# Patient Record
Sex: Male | Born: 1980 | Race: Black or African American | Hispanic: No | Marital: Single | Smoking: Current every day smoker
Health system: Southern US, Community
[De-identification: ages and names within clinical notes are randomized; demographics above are authoritative.]

## PROBLEM LIST (undated history)

## (undated) DIAGNOSIS — W3400XA Accidental discharge from unspecified firearms or gun, initial encounter: Secondary | ICD-10-CM

## (undated) DIAGNOSIS — G43019 Migraine without aura, intractable, without status migrainosus: Secondary | ICD-10-CM

## (undated) DIAGNOSIS — G43909 Migraine, unspecified, not intractable, without status migrainosus: Secondary | ICD-10-CM

## (undated) HISTORY — PX: WISDOM TOOTH EXTRACTION: SHX21

## (undated) HISTORY — PX: OTHER SURGICAL HISTORY: SHX169

## (undated) HISTORY — PX: COLOSTOMY: SHX63

## (undated) HISTORY — PX: ABDOMINAL SURGERY: SHX537

## (undated) HISTORY — DX: Migraine without aura, intractable, without status migrainosus: G43.019

---
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
## 2003-03-21 ENCOUNTER — Inpatient Hospital Stay (HOSPITAL_COMMUNITY): Admission: AC | Admit: 2003-03-21 | Discharge: 2003-03-29 | Payer: Self-pay

## 2003-03-21 ENCOUNTER — Encounter: Payer: Self-pay | Admitting: Surgery

## 2003-03-22 ENCOUNTER — Encounter: Payer: Self-pay | Admitting: General Surgery

## 2003-03-23 ENCOUNTER — Encounter: Payer: Self-pay | Admitting: General Surgery

## 2003-03-24 ENCOUNTER — Encounter: Payer: Self-pay | Admitting: General Surgery

## 2003-03-25 ENCOUNTER — Encounter: Payer: Self-pay | Admitting: General Surgery

## 2003-08-06 ENCOUNTER — Emergency Department (HOSPITAL_COMMUNITY): Admission: EM | Admit: 2003-08-06 | Discharge: 2003-08-06 | Payer: Self-pay | Admitting: Emergency Medicine

## 2003-08-26 ENCOUNTER — Emergency Department (HOSPITAL_COMMUNITY): Admission: EM | Admit: 2003-08-26 | Discharge: 2003-08-26 | Payer: Self-pay | Admitting: Emergency Medicine

## 2003-09-04 ENCOUNTER — Emergency Department (HOSPITAL_COMMUNITY): Admission: EM | Admit: 2003-09-04 | Discharge: 2003-09-04 | Payer: Self-pay | Admitting: Emergency Medicine

## 2003-09-25 ENCOUNTER — Emergency Department (HOSPITAL_COMMUNITY): Admission: EM | Admit: 2003-09-25 | Discharge: 2003-09-25 | Payer: Self-pay | Admitting: Emergency Medicine

## 2003-11-11 ENCOUNTER — Emergency Department (HOSPITAL_COMMUNITY): Admission: EM | Admit: 2003-11-11 | Discharge: 2003-11-11 | Payer: Self-pay | Admitting: *Deleted

## 2004-01-11 ENCOUNTER — Emergency Department (HOSPITAL_COMMUNITY): Admission: EM | Admit: 2004-01-11 | Discharge: 2004-01-11 | Payer: Self-pay | Admitting: Emergency Medicine

## 2004-01-12 ENCOUNTER — Inpatient Hospital Stay (HOSPITAL_COMMUNITY): Admission: AD | Admit: 2004-01-12 | Discharge: 2004-01-13 | Payer: Self-pay

## 2004-01-14 ENCOUNTER — Emergency Department (HOSPITAL_COMMUNITY): Admission: EM | Admit: 2004-01-14 | Discharge: 2004-01-14 | Payer: Self-pay | Admitting: Emergency Medicine

## 2004-01-15 ENCOUNTER — Emergency Department (HOSPITAL_COMMUNITY): Admission: EM | Admit: 2004-01-15 | Discharge: 2004-01-15 | Payer: Self-pay | Admitting: Emergency Medicine

## 2004-03-09 ENCOUNTER — Emergency Department (HOSPITAL_COMMUNITY): Admission: EM | Admit: 2004-03-09 | Discharge: 2004-03-10 | Payer: Self-pay | Admitting: Emergency Medicine

## 2004-04-21 ENCOUNTER — Emergency Department (HOSPITAL_COMMUNITY): Admission: EM | Admit: 2004-04-21 | Discharge: 2004-04-21 | Payer: Self-pay | Admitting: Emergency Medicine

## 2005-02-02 ENCOUNTER — Emergency Department (HOSPITAL_COMMUNITY): Admission: EM | Admit: 2005-02-02 | Discharge: 2005-02-02 | Payer: Self-pay | Admitting: Orthopedic Surgery

## 2005-02-19 ENCOUNTER — Emergency Department (HOSPITAL_COMMUNITY): Admission: EM | Admit: 2005-02-19 | Discharge: 2005-02-20 | Payer: Self-pay | Admitting: Emergency Medicine

## 2005-02-19 ENCOUNTER — Emergency Department (HOSPITAL_COMMUNITY): Admission: EM | Admit: 2005-02-19 | Discharge: 2005-02-19 | Payer: Self-pay | Admitting: Emergency Medicine

## 2005-02-23 ENCOUNTER — Emergency Department (HOSPITAL_COMMUNITY): Admission: EM | Admit: 2005-02-23 | Discharge: 2005-02-23 | Payer: Self-pay | Admitting: Emergency Medicine

## 2005-02-25 ENCOUNTER — Encounter: Payer: Self-pay | Admitting: Emergency Medicine

## 2005-02-25 ENCOUNTER — Inpatient Hospital Stay (HOSPITAL_COMMUNITY): Admission: AD | Admit: 2005-02-25 | Discharge: 2005-03-01 | Payer: Self-pay

## 2005-02-25 ENCOUNTER — Encounter (INDEPENDENT_AMBULATORY_CARE_PROVIDER_SITE_OTHER): Payer: Self-pay | Admitting: Specialist

## 2005-03-05 ENCOUNTER — Inpatient Hospital Stay (HOSPITAL_COMMUNITY): Admission: EM | Admit: 2005-03-05 | Discharge: 2005-03-06 | Payer: Self-pay | Admitting: Emergency Medicine

## 2005-03-19 ENCOUNTER — Ambulatory Visit (HOSPITAL_COMMUNITY): Admission: RE | Admit: 2005-03-19 | Discharge: 2005-03-19 | Payer: Self-pay | Admitting: Physician Assistant

## 2005-09-08 ENCOUNTER — Emergency Department (HOSPITAL_COMMUNITY): Admission: EM | Admit: 2005-09-08 | Discharge: 2005-09-08 | Payer: Self-pay | Admitting: Emergency Medicine

## 2005-09-12 ENCOUNTER — Emergency Department (HOSPITAL_COMMUNITY): Admission: EM | Admit: 2005-09-12 | Discharge: 2005-09-12 | Payer: Self-pay | Admitting: Emergency Medicine

## 2005-09-17 ENCOUNTER — Emergency Department (HOSPITAL_COMMUNITY): Admission: EM | Admit: 2005-09-17 | Discharge: 2005-09-17 | Payer: Self-pay | Admitting: Family Medicine

## 2005-09-17 ENCOUNTER — Emergency Department (HOSPITAL_COMMUNITY): Admission: EM | Admit: 2005-09-17 | Discharge: 2005-09-17 | Payer: Self-pay | Admitting: Emergency Medicine

## 2005-09-20 ENCOUNTER — Emergency Department (HOSPITAL_COMMUNITY): Admission: EM | Admit: 2005-09-20 | Discharge: 2005-09-20 | Payer: Self-pay | Admitting: Emergency Medicine

## 2005-10-10 ENCOUNTER — Emergency Department (HOSPITAL_COMMUNITY): Admission: EM | Admit: 2005-10-10 | Discharge: 2005-10-10 | Payer: Self-pay | Admitting: Emergency Medicine

## 2005-10-11 ENCOUNTER — Emergency Department (HOSPITAL_COMMUNITY): Admission: EM | Admit: 2005-10-11 | Discharge: 2005-10-11 | Payer: Self-pay | Admitting: Emergency Medicine

## 2006-01-02 ENCOUNTER — Inpatient Hospital Stay (HOSPITAL_COMMUNITY): Admission: EM | Admit: 2006-01-02 | Discharge: 2006-01-05 | Payer: Self-pay | Admitting: Emergency Medicine

## 2006-03-11 ENCOUNTER — Ambulatory Visit (HOSPITAL_COMMUNITY): Admission: RE | Admit: 2006-03-11 | Discharge: 2006-03-11 | Payer: Self-pay | Admitting: General Surgery

## 2008-07-30 ENCOUNTER — Emergency Department (HOSPITAL_COMMUNITY): Admission: EM | Admit: 2008-07-30 | Discharge: 2008-07-31 | Payer: Self-pay | Admitting: Emergency Medicine

## 2008-10-22 ENCOUNTER — Emergency Department (HOSPITAL_COMMUNITY): Admission: EM | Admit: 2008-10-22 | Discharge: 2008-10-22 | Payer: Self-pay | Admitting: Emergency Medicine

## 2008-12-13 ENCOUNTER — Emergency Department (HOSPITAL_COMMUNITY): Admission: EM | Admit: 2008-12-13 | Discharge: 2008-12-13 | Payer: Self-pay | Admitting: Emergency Medicine

## 2008-12-20 ENCOUNTER — Emergency Department (HOSPITAL_COMMUNITY): Admission: EM | Admit: 2008-12-20 | Discharge: 2008-12-20 | Payer: Self-pay | Admitting: Emergency Medicine

## 2009-01-03 ENCOUNTER — Emergency Department (HOSPITAL_COMMUNITY): Admission: EM | Admit: 2009-01-03 | Discharge: 2009-01-03 | Payer: Self-pay | Admitting: Emergency Medicine

## 2009-01-07 ENCOUNTER — Emergency Department (HOSPITAL_COMMUNITY): Admission: EM | Admit: 2009-01-07 | Discharge: 2009-01-07 | Payer: Self-pay | Admitting: Emergency Medicine

## 2009-01-11 ENCOUNTER — Emergency Department (HOSPITAL_COMMUNITY): Admission: EM | Admit: 2009-01-11 | Discharge: 2009-01-12 | Payer: Self-pay | Admitting: Emergency Medicine

## 2009-01-16 ENCOUNTER — Emergency Department (HOSPITAL_COMMUNITY): Admission: EM | Admit: 2009-01-16 | Discharge: 2009-01-16 | Payer: Self-pay | Admitting: Emergency Medicine

## 2009-01-18 ENCOUNTER — Emergency Department (HOSPITAL_COMMUNITY): Admission: EM | Admit: 2009-01-18 | Discharge: 2009-01-18 | Payer: Self-pay | Admitting: Emergency Medicine

## 2009-01-29 ENCOUNTER — Emergency Department (HOSPITAL_COMMUNITY): Admission: EM | Admit: 2009-01-29 | Discharge: 2009-01-29 | Payer: Self-pay | Admitting: Emergency Medicine

## 2009-03-25 ENCOUNTER — Emergency Department (HOSPITAL_COMMUNITY): Admission: EM | Admit: 2009-03-25 | Discharge: 2009-03-25 | Payer: Self-pay | Admitting: Emergency Medicine

## 2009-07-21 ENCOUNTER — Emergency Department (HOSPITAL_COMMUNITY): Admission: EM | Admit: 2009-07-21 | Discharge: 2009-07-21 | Payer: Self-pay | Admitting: Emergency Medicine

## 2009-08-24 ENCOUNTER — Emergency Department (HOSPITAL_COMMUNITY): Admission: EM | Admit: 2009-08-24 | Discharge: 2009-08-24 | Payer: Self-pay | Admitting: Emergency Medicine

## 2009-09-27 ENCOUNTER — Emergency Department (HOSPITAL_COMMUNITY): Admission: EM | Admit: 2009-09-27 | Discharge: 2009-09-27 | Payer: Self-pay | Admitting: Emergency Medicine

## 2009-10-04 ENCOUNTER — Emergency Department (HOSPITAL_COMMUNITY): Admission: EM | Admit: 2009-10-04 | Discharge: 2009-10-05 | Payer: Self-pay | Admitting: Emergency Medicine

## 2009-10-22 ENCOUNTER — Ambulatory Visit: Payer: Self-pay | Admitting: Family Medicine

## 2009-10-22 DIAGNOSIS — R1084 Generalized abdominal pain: Secondary | ICD-10-CM

## 2009-10-22 DIAGNOSIS — L819 Disorder of pigmentation, unspecified: Secondary | ICD-10-CM | POA: Insufficient documentation

## 2009-10-22 LAB — CONVERTED CEMR LAB
AST: 22 units/L (ref 0–37)
Albumin: 4.6 g/dL (ref 3.5–5.2)
Alkaline Phosphatase: 65 units/L (ref 39–117)
BUN: 9 mg/dL (ref 6–23)
Barbiturate Quant, Ur: NEGATIVE
Cocaine Metabolites: POSITIVE — AB
Creatinine, Ser: 0.96 mg/dL (ref 0.40–1.50)
Creatinine,U: 176.4 mg/dL
Glucose, Bld: 82 mg/dL (ref 70–99)
HCT: 40.8 % (ref 39.0–52.0)
HCV Ab: NEGATIVE
Hemoglobin: 14 g/dL (ref 13.0–17.0)
Hep B S Ab: NEGATIVE
MCHC: 34.3 g/dL (ref 30.0–36.0)
MCV: 86.6 fL (ref 78.0–100.0)
Opiates: NEGATIVE
Phencyclidine (PCP): NEGATIVE
Propoxyphene: NEGATIVE
RBC: 4.71 M/uL (ref 4.22–5.81)
RDW: 12.6 % (ref 11.5–15.5)

## 2009-10-25 ENCOUNTER — Telehealth: Payer: Self-pay | Admitting: Family Medicine

## 2009-10-25 ENCOUNTER — Encounter: Payer: Self-pay | Admitting: Family Medicine

## 2009-10-25 DIAGNOSIS — F1410 Cocaine abuse, uncomplicated: Secondary | ICD-10-CM | POA: Insufficient documentation

## 2009-10-25 DIAGNOSIS — F1210 Cannabis abuse, uncomplicated: Secondary | ICD-10-CM | POA: Insufficient documentation

## 2009-11-19 ENCOUNTER — Emergency Department (HOSPITAL_COMMUNITY): Admission: EM | Admit: 2009-11-19 | Discharge: 2009-11-19 | Payer: Self-pay | Admitting: Emergency Medicine

## 2010-01-07 ENCOUNTER — Emergency Department (HOSPITAL_COMMUNITY): Admission: EM | Admit: 2010-01-07 | Discharge: 2010-01-07 | Payer: Self-pay | Admitting: Emergency Medicine

## 2010-01-17 ENCOUNTER — Encounter: Payer: Self-pay | Admitting: Family Medicine

## 2010-01-21 ENCOUNTER — Emergency Department (HOSPITAL_COMMUNITY): Admission: EM | Admit: 2010-01-21 | Discharge: 2010-01-21 | Payer: Self-pay | Admitting: Emergency Medicine

## 2010-02-07 ENCOUNTER — Emergency Department (HOSPITAL_COMMUNITY): Admission: EM | Admit: 2010-02-07 | Discharge: 2010-02-07 | Payer: Self-pay | Admitting: Emergency Medicine

## 2010-02-11 ENCOUNTER — Encounter (INDEPENDENT_AMBULATORY_CARE_PROVIDER_SITE_OTHER): Payer: Self-pay | Admitting: Surgery

## 2010-02-11 ENCOUNTER — Inpatient Hospital Stay (HOSPITAL_COMMUNITY): Admission: RE | Admit: 2010-02-11 | Discharge: 2010-02-18 | Payer: Self-pay | Admitting: Surgery

## 2010-02-20 ENCOUNTER — Inpatient Hospital Stay (HOSPITAL_COMMUNITY): Admission: AD | Admit: 2010-02-20 | Discharge: 2010-02-26 | Payer: Self-pay

## 2010-02-28 ENCOUNTER — Emergency Department (HOSPITAL_COMMUNITY): Admission: EM | Admit: 2010-02-28 | Discharge: 2010-02-28 | Payer: Self-pay | Admitting: Emergency Medicine

## 2010-05-08 ENCOUNTER — Emergency Department (HOSPITAL_COMMUNITY): Admission: EM | Admit: 2010-05-08 | Discharge: 2009-10-10 | Payer: Self-pay | Admitting: Emergency Medicine
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
## 2010-07-01 NOTE — Letter (Signed)
Summary: Generic Letter  [facility]  [address]   [place], [place] [address]   Phone: [phone]  Fax: [phone]    10/25/2009  [facility] [address] [place], [place]  [address]  Dear Mr. Elders,   It was a pleasure to meet you at your first visit in our office earlier this week.  I have tried to call you at the number you gave our office [phone]) but the number is no longer in service.  I am pleased to tell you that your bloodwork all came back in the normal range.    Please call if you have any questions.      Sincerely,   Paula Compton MD  Appended Document: Generic Letter mailed.

## 2010-07-01 NOTE — Miscellaneous (Signed)
  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

## 2010-07-01 NOTE — Progress Notes (Signed)
  Phone Note Outgoing Call   Call placed by: Paula Compton MD,  Oct 25, 2009 8:56 AM Call placed to: Patient Summary of Call: Called patient at [phone].  Call could not be completed because "number not in service".  I wish to inform him of negative HIV, negative Hepatitis panel, and normal CBC and metabolic panel.  I will send a letter informing of negative bloodwork.  I prefer to wait to discuss results of UDS in next face-to-face encounter. Initial call taken by: Paula Compton MD,  Oct 25, 2009 8:58 AM

## 2010-07-04 NOTE — Consult Note (Signed)
Summary: Disability Determination Services  Disability Determination Services   Imported By: Clydell Hakim 02/06/2010 16:03:34  _____________________________________________________________________  External Attachment:    Type:   Image     Comment:   External Document

## 2010-08-14 LAB — DIFFERENTIAL
Basophils Absolute: 0.1 10*3/uL (ref 0.0–0.1)
Eosinophils Absolute: 0.5 10*3/uL (ref 0.0–0.7)
Lymphocytes Relative: 24 % (ref 12–46)
Lymphocytes Relative: 30 % (ref 12–46)
Lymphocytes Relative: 33 % (ref 12–46)
Lymphs Abs: 2.3 10*3/uL (ref 0.7–4.0)
Lymphs Abs: 2.8 10*3/uL (ref 0.7–4.0)
Lymphs Abs: 3.3 10*3/uL (ref 0.7–4.0)
Monocytes Absolute: 1 10*3/uL (ref 0.1–1.0)
Monocytes Relative: 8 % (ref 3–12)
Neutro Abs: 5 10*3/uL (ref 1.7–7.7)
Neutro Abs: 5.3 10*3/uL (ref 1.7–7.7)
Neutrophils Relative %: 58 % (ref 43–77)
Neutrophils Relative %: 64 % (ref 43–77)

## 2010-08-14 LAB — URINE MICROSCOPIC-ADD ON

## 2010-08-14 LAB — CBC
HCT: 24.5 % — ABNORMAL LOW (ref 39.0–52.0)
HCT: 25.1 % — ABNORMAL LOW (ref 39.0–52.0)
HCT: 26.9 % — ABNORMAL LOW (ref 39.0–52.0)
HCT: 27.5 % — ABNORMAL LOW (ref 39.0–52.0)
HCT: 27.9 % — ABNORMAL LOW (ref 39.0–52.0)
Hemoglobin: 12.2 g/dL — ABNORMAL LOW (ref 13.0–17.0)
Hemoglobin: 14.4 g/dL (ref 13.0–17.0)
Hemoglobin: 8.4 g/dL — ABNORMAL LOW (ref 13.0–17.0)
Hemoglobin: 9.1 g/dL — ABNORMAL LOW (ref 13.0–17.0)
Hemoglobin: 9.5 g/dL — ABNORMAL LOW (ref 13.0–17.0)
MCH: 28.6 pg (ref 26.0–34.0)
MCH: 30.5 pg (ref 26.0–34.0)
MCHC: 33.1 g/dL (ref 30.0–36.0)
MCHC: 33.5 g/dL (ref 30.0–36.0)
MCHC: 34.2 g/dL (ref 30.0–36.0)
MCV: 85.4 fL (ref 78.0–100.0)
MCV: 86.3 fL (ref 78.0–100.0)
MCV: 86.4 fL (ref 78.0–100.0)
MCV: 86.6 fL (ref 78.0–100.0)
Platelets: 172 10*3/uL (ref 150–400)
Platelets: 494 10*3/uL — ABNORMAL HIGH (ref 150–400)
Platelets: 233 10*3/uL (ref 150–400)
RBC: 2.91 MIL/uL — ABNORMAL LOW (ref 4.22–5.81)
RBC: 2.94 MIL/uL — ABNORMAL LOW (ref 4.22–5.81)
RBC: 3.15 MIL/uL — ABNORMAL LOW (ref 4.22–5.81)
RBC: 3.31 MIL/uL — ABNORMAL LOW (ref 4.22–5.81)
RBC: 3.77 MIL/uL — ABNORMAL LOW (ref 4.22–5.81)
RBC: 4.02 MIL/uL — ABNORMAL LOW (ref 4.22–5.81)
RBC: 4.57 MIL/uL (ref 4.22–5.81)
RBC: 4.73 MIL/uL (ref 4.22–5.81)
RDW: 12.2 % (ref 11.5–15.5)
RDW: 12.7 % (ref 11.5–15.5)
RDW: 12.8 % (ref 11.5–15.5)
WBC: 10.8 10*3/uL — ABNORMAL HIGH (ref 4.0–10.5)
WBC: 12.6 10*3/uL — ABNORMAL HIGH (ref 4.0–10.5)
WBC: 13.1 10*3/uL — ABNORMAL HIGH (ref 4.0–10.5)
WBC: 15.3 10*3/uL — ABNORMAL HIGH (ref 4.0–10.5)
WBC: 9.1 10*3/uL (ref 4.0–10.5)
WBC: 9.6 10*3/uL (ref 4.0–10.5)

## 2010-08-14 LAB — BASIC METABOLIC PANEL
BUN: 2 mg/dL — ABNORMAL LOW (ref 6–23)
BUN: 3 mg/dL — ABNORMAL LOW (ref 6–23)
BUN: 4 mg/dL — ABNORMAL LOW (ref 6–23)
BUN: 9 mg/dL (ref 6–23)
CO2: 26 mEq/L (ref 19–32)
CO2: 29 mEq/L (ref 19–32)
Calcium: 8.7 mg/dL (ref 8.4–10.5)
Calcium: 9.1 mg/dL (ref 8.4–10.5)
Calcium: 9.7 mg/dL (ref 8.4–10.5)
Calcium: 9.9 mg/dL (ref 8.4–10.5)
Chloride: 101 mEq/L (ref 96–112)
Chloride: 101 mEq/L (ref 96–112)
Chloride: 103 mEq/L (ref 96–112)
Chloride: 103 mEq/L (ref 96–112)
Creatinine, Ser: 0.98 mg/dL (ref 0.4–1.5)
Creatinine, Ser: 0.89 mg/dL (ref 0.4–1.5)
GFR calc Af Amer: >60 mL/min (ref >60)
GFR calc Af Amer: >60 mL/min (ref >60)
GFR calc Af Amer: >60 mL/min (ref >60)
GFR calc Af Amer: >60 mL/min (ref >60)
GFR calc Af Amer: >60 mL/min (ref >60)
GFR calc non Af Amer: >60 mL/min (ref >60)
GFR calc non Af Amer: >60 mL/min (ref >60)
GFR calc non Af Amer: >60 mL/min (ref >60)
Glucose, Bld: 88 mg/dL (ref 70–99)
Glucose, Bld: 95 mg/dL (ref 70–99)
Glucose, Bld: 156 mg/dL — ABNORMAL HIGH (ref 70–99)
Potassium: 3.3 mEq/L — ABNORMAL LOW (ref 3.5–5.1)
Potassium: 3.4 mEq/L — ABNORMAL LOW (ref 3.5–5.1)
Potassium: 3.6 mEq/L (ref 3.5–5.1)
Potassium: 3.9 mEq/L (ref 3.5–5.1)
Sodium: 136 mEq/L (ref 135–145)
Sodium: 140 mEq/L (ref 135–145)

## 2010-08-14 LAB — URINALYSIS, ROUTINE W REFLEX MICROSCOPIC
Bilirubin Urine: NEGATIVE
Glucose, UA: NEGATIVE mg/dL
Hgb urine dipstick: NEGATIVE
Hgb urine dipstick: NEGATIVE
Ketones, ur: 15 mg/dL — AB
Nitrite: NEGATIVE
Protein, ur: NEGATIVE mg/dL
Protein, ur: NEGATIVE mg/dL
Specific Gravity, Urine: 1.019 (ref 1.005–1.030)
Specific Gravity, Urine: 1.024 (ref 1.005–1.030)
Urobilinogen, UA: 0.2 mg/dL (ref 0.0–1.0)
Urobilinogen, UA: 0.2 mg/dL (ref 0.0–1.0)
pH: 5.5 (ref 5.0–8.0)

## 2010-08-14 LAB — POCT I-STAT, CHEM 8
Calcium, Ion: 1.2 mmol/L (ref 1.12–1.32)
Glucose, Bld: 90 mg/dL (ref 70–99)
HCT: 31 % — ABNORMAL LOW (ref 39.0–52.0)
Hemoglobin: 10.5 g/dL — ABNORMAL LOW (ref 13.0–17.0)
Potassium: 3.2 mEq/L — ABNORMAL LOW (ref 3.5–5.1)
TCO2: 31 mmol/L (ref 0–100)

## 2010-08-14 LAB — URINALYSIS, MICROSCOPIC ONLY
Bilirubin Urine: NEGATIVE
Glucose, UA: NEGATIVE mg/dL
Hgb urine dipstick: NEGATIVE
Specific Gravity, Urine: 1.027 (ref 1.005–1.030)
Urobilinogen, UA: 0.2 mg/dL (ref 0.0–1.0)
pH: 6 (ref 5.0–8.0)

## 2010-08-14 LAB — HEPATIC FUNCTION PANEL
ALT: 24 U/L (ref 0–53)
Albumin: 4.9 g/dL (ref 3.5–5.2)
Alkaline Phosphatase: 55 U/L (ref 39–117)
Total Protein: 8.4 g/dL — ABNORMAL HIGH (ref 6.0–8.3)

## 2010-08-14 LAB — CULTURE, ROUTINE-ABSCESS

## 2010-08-14 LAB — MRSA PCR SCREENING: MRSA by PCR: NEGATIVE

## 2010-08-14 LAB — URINE CULTURE: Colony Count: NO GROWTH

## 2010-08-14 LAB — MRSA CULTURE

## 2010-08-15 LAB — CBC
Hemoglobin: 13.1 g/dL (ref 13.0–17.0)
MCH: 30.4 pg (ref 26.0–34.0)
MCV: 90 fL (ref 78.0–100.0)
RBC: 4.31 MIL/uL (ref 4.22–5.81)
WBC: 9.2 10*3/uL (ref 4.0–10.5)

## 2010-08-15 LAB — DIFFERENTIAL
Eosinophils Absolute: 0.3 10*3/uL (ref 0.0–0.7)
Eosinophils Relative: 4 % (ref 0–5)
Lymphocytes Relative: 26 % (ref 12–46)
Lymphs Abs: 2.4 10*3/uL (ref 0.7–4.0)
Monocytes Relative: 7 % (ref 3–12)
Neutrophils Relative %: 62 % (ref 43–77)

## 2010-08-15 LAB — BASIC METABOLIC PANEL
CO2: 27 mEq/L (ref 19–32)
Chloride: 106 mEq/L (ref 96–112)
GFR calc Af Amer: >60 mL/min (ref >60)
Sodium: 141 mEq/L (ref 135–145)

## 2010-08-15 LAB — LACTIC ACID, PLASMA: Lactic Acid, Venous: 0.8 mmol/L (ref 0.5–2.2)

## 2010-08-17 LAB — GC/CHLAMYDIA PROBE AMP, GENITAL: GC Probe Amp, Genital: NEGATIVE

## 2010-08-24 LAB — POCT CARDIAC MARKERS
Myoglobin, poc: 72 ng/mL (ref 12–200)
Troponin i, poc: <0.05 ng/mL (ref 0.00–0.09)
Troponin i, poc: <0.05 ng/mL (ref 0.00–0.09)

## 2010-08-24 LAB — COMPREHENSIVE METABOLIC PANEL
ALT: 23 U/L (ref 0–53)
AST: 45 U/L — ABNORMAL HIGH (ref 0–37)
Alkaline Phosphatase: 65 U/L (ref 39–117)
CO2: 21 mEq/L (ref 19–32)
Calcium: 10.2 mg/dL (ref 8.4–10.5)
Chloride: 100 mEq/L (ref 96–112)
GFR calc Af Amer: >60 mL/min (ref >60)
GFR calc non Af Amer: >60 mL/min (ref >60)
Glucose, Bld: 84 mg/dL (ref 70–99)
Sodium: 138 mEq/L (ref 135–145)
Total Bilirubin: 1.2 mg/dL (ref 0.3–1.2)

## 2010-08-24 LAB — CBC
Hemoglobin: 15 g/dL (ref 13.0–17.0)
MCHC: 34 g/dL (ref 30.0–36.0)
RBC: 4.9 MIL/uL (ref 4.22–5.81)
WBC: 11.7 10*3/uL — ABNORMAL HIGH (ref 4.0–10.5)

## 2010-08-24 LAB — URINALYSIS, ROUTINE W REFLEX MICROSCOPIC
Glucose, UA: NEGATIVE mg/dL
Ketones, ur: >80 mg/dL — AB
Leukocytes, UA: NEGATIVE
Protein, ur: 30 mg/dL — AB
Urobilinogen, UA: 0.2 mg/dL (ref 0.0–1.0)

## 2010-08-24 LAB — DIFFERENTIAL
Basophils Absolute: 0 10*3/uL (ref 0.0–0.1)
Basophils Relative: 0 % (ref 0–1)
Eosinophils Absolute: 0 10*3/uL (ref 0.0–0.7)
Eosinophils Relative: 0 % (ref 0–5)
Neutrophils Relative %: 88 % — ABNORMAL HIGH (ref 43–77)

## 2010-08-24 LAB — URINE MICROSCOPIC-ADD ON

## 2010-09-06 LAB — DIFFERENTIAL
Basophils Absolute: 0.1 10*3/uL (ref 0.0–0.1)
Basophils Relative: 1 % (ref 0–1)
Eosinophils Absolute: 0.8 10*3/uL — ABNORMAL HIGH (ref 0.0–0.7)
Eosinophils Absolute: 0.8 10*3/uL — ABNORMAL HIGH (ref 0.0–0.7)
Eosinophils Relative: 6 % — ABNORMAL HIGH (ref 0–5)
Eosinophils Relative: 7 % — ABNORMAL HIGH (ref 0–5)
Lymphocytes Relative: 26 % (ref 12–46)
Lymphs Abs: 3.4 10*3/uL (ref 0.7–4.0)
Monocytes Absolute: 0.9 10*3/uL (ref 0.1–1.0)
Monocytes Relative: 7 % (ref 3–12)
Neutrophils Relative %: 62 % (ref 43–77)

## 2010-09-06 LAB — CBC
HCT: 43.9 % (ref 39.0–52.0)
MCHC: 33.6 g/dL (ref 30.0–36.0)
MCHC: 33 g/dL (ref 30.0–36.0)
MCV: 89.7 fL (ref 78.0–100.0)
MCV: 90.2 fL (ref 78.0–100.0)
Platelets: 211 10*3/uL (ref 150–400)
Platelets: 196 10*3/uL (ref 150–400)
RDW: 13.2 % (ref 11.5–15.5)
WBC: 13.3 10*3/uL — ABNORMAL HIGH (ref 4.0–10.5)
WBC: 11.8 10*3/uL — ABNORMAL HIGH (ref 4.0–10.5)

## 2010-09-06 LAB — POCT I-STAT, CHEM 8
Creatinine, Ser: 1.2 mg/dL (ref 0.4–1.5)
HCT: 47 % (ref 39.0–52.0)
Hemoglobin: 16 g/dL (ref 13.0–17.0)
Potassium: 4 mEq/L (ref 3.5–5.1)
Sodium: 140 mEq/L (ref 135–145)
TCO2: 26 mmol/L (ref 0–100)

## 2010-09-06 LAB — BASIC METABOLIC PANEL
BUN: 15 mg/dL (ref 6–23)
CO2: 27 mEq/L (ref 19–32)
Calcium: 9.5 mg/dL (ref 8.4–10.5)
Chloride: 105 mEq/L (ref 96–112)
Creatinine, Ser: 1.23 mg/dL (ref 0.4–1.5)

## 2010-09-07 LAB — DIFFERENTIAL
Basophils Absolute: 0 10*3/uL (ref 0.0–0.1)
Basophils Relative: 0 % (ref 0–1)
Lymphocytes Relative: 24 % (ref 12–46)
Monocytes Absolute: 1 10*3/uL (ref 0.1–1.0)
Monocytes Relative: 9 % (ref 3–12)
Neutro Abs: 6.9 10*3/uL (ref 1.7–7.7)
Neutrophils Relative %: 63 % (ref 43–77)

## 2010-09-07 LAB — CBC
Hemoglobin: 14.7 g/dL (ref 13.0–17.0)
MCHC: 33 g/dL (ref 30.0–36.0)
RBC: 5.03 MIL/uL (ref 4.22–5.81)
RDW: 13.4 % (ref 11.5–15.5)

## 2010-09-07 LAB — BASIC METABOLIC PANEL
CO2: 27 mEq/L (ref 19–32)
Calcium: 9.8 mg/dL (ref 8.4–10.5)
Creatinine, Ser: 1.11 mg/dL (ref 0.4–1.5)
GFR calc Af Amer: >60 mL/min (ref >60)
GFR calc non Af Amer: >60 mL/min (ref >60)
Sodium: 139 mEq/L (ref 135–145)
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
## 2010-10-14 NOTE — Consult Note (Signed)
NAMESRIRAM, FEBLES                 ACCOUNT NO.:  [id number]   MEDICAL RECORD NO.:  [id number]          PATIENT TYPE:  EMS   LOCATION:  ED                           FACILITY:  [facility]   PHYSICIAN:  Lennie Muckle, MD      DATE OF BIRTH:  September 02, 1980   DATE OF CONSULTATION:  01/17/2009  DATE OF DISCHARGE:  01/16/2009                                 CONSULTATION   HISTORY:  Mr. Essick is a 30 year old male who has an ostomy in the right  lower quadrant.  I have seen him 4 weeks ago for prolapsed ostomy. He  apparently came back to the emergency department with prolapse, reduced  by Dr. Bebe Shaggy.  He was given 6 of Dilaudid.  The patient states that  he had called the office and was told he had to pay 500 dollars prior to  being seen.  Did see him in the emergency department.  His vitals were  stable.  He had blood pressure 120/78, temperature 97.2.  He was lying  on the stretcher in no acute distress.  He was complaining of left-sided  abdominal pain and prolapsed ostomy.  On examination he did have  prolapse of the ostomy.  No peritoneal signs.   ASSESSMENT AND PLAN:  Prolapsed ostomy. This was reduced successfully in  the emergency department.  I told the patient to call the office to talk  with the financial counselor in order to set up a payment plan in order  to facilitate being scheduled for an ostomy takedown.      Lennie Muckle, MD  Electronically Signed     ALA/MEDQ  D:  01/16/2009  T:  01/17/2009  Job:  [id number]
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
## 2010-10-17 NOTE — Op Note (Signed)
[name], [name]                             ACCOUNT NO.:  [id number]   MEDICAL RECORD NO.:  [id number]                   PATIENT TYPE:  EMS   LOCATION:  ED                                   FACILITY:  [facility]   PHYSICIAN:  Abigail Miyamoto, M.D.              DATE OF BIRTH:  1981-03-07   DATE OF PROCEDURE:  01/11/2004  DATE OF DISCHARGE:  01/11/2004                                 OPERATIVE REPORT   PREOPERATIVE DIAGNOSIS:  Prolapsed colostomy.   POSTOPERATIVE DIAGNOSIS:  Prolapsed colostomy.   PROCEDURE:  Reduction of prolapsed colostomy.   SURGEON:  Dr. Riley Lam A. Blackman   ANESTHESIA:  General endotracheal anesthesia.   ESTIMATED BLOOD LOSS:  Minimal.   PROCEDURE IN DETAIL:  The patient was brought to the operating room,  identified as [name]  He was placed supine on the operating room  table, and general anesthesia was induced.  He was found to have a protruded  swollen ostomy which was protruding approximately 4-5 inches.  It was  circumferentially injected with a solution of 3 mL Wydase and 7 mL Marcaine  without epinephrine.  After this was done, the prolapse was massaged, and  all the edema was able to be massaged out, and then it was finally easily  reduced.  Once it was reduced, a proctoscope was inserted into the ostomy,  and the mucosa was examined circumferentially and found to be viable.  At  this point, the patient was then extubated in the operating room.  An ostomy  appliance was placed back on top of the ostomy, and the patient was taken in  stable condition from the operating room to the recovery room.                                               Abigail Miyamoto, M.D.    DB/MEDQ  D:  01/11/2004  T:  01/13/2004  Job:  [id number]
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
## 2011-04-03 ENCOUNTER — Emergency Department (HOSPITAL_COMMUNITY)
Admission: EM | Admit: 2011-04-03 | Discharge: 2011-04-03 | Disposition: A | Discharge status: Home / Self Care | Payer: Self-pay | Attending: Emergency Medicine | Admitting: Emergency Medicine

## 2011-04-03 ENCOUNTER — Emergency Department (HOSPITAL_COMMUNITY): Payer: Self-pay

## 2011-04-03 DIAGNOSIS — R1084 Generalized abdominal pain: Secondary | ICD-10-CM | POA: Insufficient documentation

## 2011-04-03 DIAGNOSIS — B9789 Other viral agents as the cause of diseases classified elsewhere: Secondary | ICD-10-CM | POA: Insufficient documentation

## 2011-04-03 DIAGNOSIS — E860 Dehydration: Secondary | ICD-10-CM | POA: Insufficient documentation

## 2011-04-03 DIAGNOSIS — R112 Nausea with vomiting, unspecified: Secondary | ICD-10-CM | POA: Insufficient documentation

## 2011-04-03 LAB — CBC
HCT: 39.5 % (ref 39.0–52.0)
Hemoglobin: 13.7 g/dL (ref 13.0–17.0)
MCV: 85.3 fL (ref 78.0–100.0)
RBC: 4.63 MIL/uL (ref 4.22–5.81)
WBC: 12.1 10*3/uL — ABNORMAL HIGH (ref 4.0–10.5)

## 2011-04-03 LAB — COMPREHENSIVE METABOLIC PANEL
Albumin: 4.1 g/dL (ref 3.5–5.2)
Alkaline Phosphatase: 64 U/L (ref 39–117)
BUN: 8 mg/dL (ref 6–23)
CO2: 21 mEq/L (ref 19–32)
Chloride: 103 mEq/L (ref 96–112)
Creatinine, Ser: 1.05 mg/dL (ref 0.50–1.35)
GFR calc Af Amer: >90 mL/min (ref >90)
GFR calc non Af Amer: >90 mL/min (ref >90)
Glucose, Bld: 123 mg/dL — ABNORMAL HIGH (ref 70–99)
Potassium: 3 mEq/L — ABNORMAL LOW (ref 3.5–5.1)
Total Bilirubin: 0.5 mg/dL (ref 0.3–1.2)

## 2011-04-03 LAB — URINALYSIS, ROUTINE W REFLEX MICROSCOPIC
Glucose, UA: NEGATIVE mg/dL
Hgb urine dipstick: NEGATIVE
Protein, ur: 30 mg/dL — AB

## 2011-04-03 LAB — DIFFERENTIAL
Basophils Absolute: 0 10*3/uL (ref 0.0–0.1)
Lymphocytes Relative: 8 % — ABNORMAL LOW (ref 12–46)
Lymphs Abs: 1 10*3/uL (ref 0.7–4.0)
Neutro Abs: 9.6 10*3/uL — ABNORMAL HIGH (ref 1.7–7.7)

## 2011-04-03 LAB — LIPASE, BLOOD: Lipase: 20 U/L (ref 11–59)

## 2011-05-23 ENCOUNTER — Emergency Department (HOSPITAL_COMMUNITY)
Admission: EM | Admit: 2011-05-23 | Discharge: 2011-05-23 | Disposition: A | Discharge status: Home / Self Care | Payer: Self-pay | Attending: Emergency Medicine | Admitting: Emergency Medicine

## 2011-05-23 ENCOUNTER — Emergency Department (HOSPITAL_COMMUNITY): Payer: Self-pay

## 2011-05-23 ENCOUNTER — Encounter: Payer: Self-pay | Admitting: Emergency Medicine

## 2011-05-23 DIAGNOSIS — M79609 Pain in unspecified limb: Secondary | ICD-10-CM | POA: Insufficient documentation

## 2011-05-23 DIAGNOSIS — R112 Nausea with vomiting, unspecified: Secondary | ICD-10-CM | POA: Insufficient documentation

## 2011-05-23 DIAGNOSIS — K529 Noninfective gastroenteritis and colitis, unspecified: Secondary | ICD-10-CM

## 2011-05-23 DIAGNOSIS — E860 Dehydration: Secondary | ICD-10-CM | POA: Insufficient documentation

## 2011-05-23 DIAGNOSIS — Z9889 Other specified postprocedural states: Secondary | ICD-10-CM | POA: Insufficient documentation

## 2011-05-23 DIAGNOSIS — R10819 Abdominal tenderness, unspecified site: Secondary | ICD-10-CM | POA: Insufficient documentation

## 2011-05-23 DIAGNOSIS — K5289 Other specified noninfective gastroenteritis and colitis: Secondary | ICD-10-CM | POA: Insufficient documentation

## 2011-05-23 DIAGNOSIS — R1084 Generalized abdominal pain: Secondary | ICD-10-CM | POA: Insufficient documentation

## 2011-05-23 LAB — URINALYSIS, ROUTINE W REFLEX MICROSCOPIC
Leukocytes, UA: NEGATIVE
Protein, ur: NEGATIVE mg/dL
Urobilinogen, UA: 0.2 mg/dL (ref 0.0–1.0)

## 2011-05-23 LAB — COMPREHENSIVE METABOLIC PANEL
BUN: 15 mg/dL (ref 6–23)
CO2: 22 mEq/L (ref 19–32)
Chloride: 101 mEq/L (ref 96–112)
Creatinine, Ser: 1.02 mg/dL (ref 0.50–1.35)
GFR calc non Af Amer: >90 mL/min (ref >90)
Total Bilirubin: 0.5 mg/dL (ref 0.3–1.2)

## 2011-05-23 LAB — DIFFERENTIAL
Basophils Absolute: 0 10*3/uL (ref 0.0–0.1)
Eosinophils Relative: 0 % (ref 0–5)
Lymphocytes Relative: 6 % — ABNORMAL LOW (ref 12–46)
Lymphs Abs: 1 10*3/uL (ref 0.7–4.0)
Monocytes Absolute: 0.5 10*3/uL (ref 0.1–1.0)

## 2011-05-23 LAB — CBC
HCT: 38.7 % — ABNORMAL LOW (ref 39.0–52.0)
MCH: 29 pg (ref 26.0–34.0)
MCV: 85.6 fL (ref 78.0–100.0)
RDW: 12.6 % (ref 11.5–15.5)
WBC: 16 10*3/uL — ABNORMAL HIGH (ref 4.0–10.5)

## 2011-05-23 MED ORDER — ONDANSETRON HCL 4 MG PO TABS: 4.0000 mg | ORAL_TABLET | Freq: Three times a day (TID) | ORAL | Status: AC | PRN

## 2011-05-23 MED ORDER — HYDROMORPHONE HCL PF 1 MG/ML IJ SOLN
1.0000 mg | Freq: Once | INTRAMUSCULAR | Status: AC
  Administered 2011-05-23: 1 mg via INTRAVENOUS
  Filled 2011-05-23: qty 1

## 2011-05-23 MED ORDER — IOHEXOL 300 MG/ML  SOLN
100.0000 mL | Freq: Once | INTRAMUSCULAR | Status: AC | PRN
  Administered 2011-05-23: 100 mL via INTRAVENOUS

## 2011-05-23 MED ORDER — SODIUM CHLORIDE 0.9 % IV BOLUS (SEPSIS)
1000.0000 mL | Freq: Once | INTRAVENOUS | Status: AC
  Administered 2011-05-23: 1000 mL via INTRAVENOUS

## 2011-05-23 MED ORDER — FAMOTIDINE IN NACL 20-0.9 MG/50ML-% IV SOLN
20.0000 mg | Freq: Once | INTRAVENOUS | Status: AC
  Administered 2011-05-23: 20 mg via INTRAVENOUS
  Filled 2011-05-23: qty 50

## 2011-05-23 MED ORDER — ONDANSETRON HCL 4 MG/2ML IJ SOLN
4.0000 mg | Freq: Once | INTRAMUSCULAR | Status: AC
  Administered 2011-05-23: 4 mg via INTRAVENOUS
  Filled 2011-05-23: qty 2

## 2011-05-23 MED ORDER — IOHEXOL 300 MG/ML  SOLN
20.0000 mL | Freq: Once | INTRAMUSCULAR | Status: AC | PRN
  Administered 2011-05-23: 20 mL via ORAL

## 2011-05-23 MED ORDER — SODIUM CHLORIDE 0.9 % IV BOLUS (SEPSIS): 1000.0000 mL | Freq: Once | INTRAVENOUS | Status: DC

## 2011-05-23 MED ORDER — IOHEXOL 300 MG/ML  SOLN: 20.0000 mL | INTRAMUSCULAR | Status: AC

## 2011-05-23 MED ORDER — OXYCODONE-ACETAMINOPHEN 5-325 MG PO TABS: 2.0000 | ORAL_TABLET | ORAL | Status: AC | PRN

## 2011-05-23 NOTE — ED Provider Notes (Signed)
Patient report received from Dr. Nelida Gores who has seen and evaluate this pt. This is a 30 year old male presenting to the ED with chief complaints of nausea, vomiting, and abdominal pain. He has a significant history of abdominal surgery. He is currently awaits CT scan to rule out obstruction.  4:33 PM Patient's pain is localized to this right abdominal quadrant. The pain is reproducible on palpation. Increasing pain after drinking oral contrast. Pain medication given.  5:37 PM Patient's abdominal pain and nausea improves with antinausea and pain medication here in the ED. Abdominal CT scans shows no acute finding. Patient does have an elevated white count with a left shift. This is likely gastroenteritis. Patient appears to be dehydrated. Will continue IV hydration and will discharge once patient is able to tolerate by mouth.  Fayrene Helper, [place] 05/23/11 [phone]

## 2011-05-23 NOTE — ED Notes (Signed)
Discharge instructions reviewed with pt.  Verbalizes understanding.  No questions asked;  No further c/o's voiced.  Pt ambulatory to lobby.  NAD noted; VSS.

## 2011-05-23 NOTE — ED Notes (Signed)
Rt. Upper quad. Pain for last 2 days. Last bm on x 2 days, which was normal. Emesis - bile.
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
## 2011-05-23 NOTE — ED Notes (Signed)
Pt. Did not drink contrast until he received pain and nausea medications. CT aware and will follow-up

## 2011-05-23 NOTE — ED Notes (Signed)
Pt. Stated, i started throwing up this am and my stomach hurts so bad.

## 2011-08-06 ENCOUNTER — Emergency Department (HOSPITAL_COMMUNITY): Payer: Self-pay

## 2011-08-06 ENCOUNTER — Emergency Department (HOSPITAL_COMMUNITY)
Admission: EM | Admit: 2011-08-06 | Discharge: 2011-08-06 | Disposition: A | Discharge status: Home Health | Payer: Self-pay | Attending: Emergency Medicine | Admitting: Emergency Medicine

## 2011-08-06 ENCOUNTER — Encounter (HOSPITAL_COMMUNITY): Payer: Self-pay

## 2011-08-06 DIAGNOSIS — E860 Dehydration: Secondary | ICD-10-CM | POA: Insufficient documentation

## 2011-08-06 DIAGNOSIS — E872 Acidosis, unspecified: Secondary | ICD-10-CM | POA: Insufficient documentation

## 2011-08-06 DIAGNOSIS — R1110 Vomiting, unspecified: Secondary | ICD-10-CM | POA: Insufficient documentation

## 2011-08-06 DIAGNOSIS — R109 Unspecified abdominal pain: Secondary | ICD-10-CM | POA: Insufficient documentation

## 2011-08-06 LAB — LIPASE, BLOOD: Lipase: 15 U/L (ref 11–59)

## 2011-08-06 LAB — COMPREHENSIVE METABOLIC PANEL
BUN: 13 mg/dL (ref 6–23)
CO2: 16 mEq/L — ABNORMAL LOW (ref 19–32)
Calcium: 10.3 mg/dL (ref 8.4–10.5)
Chloride: 102 mEq/L (ref 96–112)
Creatinine, Ser: 1.06 mg/dL (ref 0.50–1.35)
GFR calc Af Amer: >90 mL/min (ref >90)
GFR calc non Af Amer: >90 mL/min (ref >90)
Total Bilirubin: 0.5 mg/dL (ref 0.3–1.2)

## 2011-08-06 LAB — DIFFERENTIAL
Eosinophils Relative: 1 % (ref 0–5)
Lymphocytes Relative: 28 % (ref 12–46)
Monocytes Absolute: 1 10*3/uL (ref 0.1–1.0)
Monocytes Relative: 6 % (ref 3–12)
Neutro Abs: 11 10*3/uL — ABNORMAL HIGH (ref 1.7–7.7)

## 2011-08-06 LAB — RAPID URINE DRUG SCREEN, HOSP PERFORMED
Barbiturates: NOT DETECTED
Benzodiazepines: NOT DETECTED

## 2011-08-06 LAB — CBC
HCT: 41.4 % (ref 39.0–52.0)
Hemoglobin: 14.1 g/dL (ref 13.0–17.0)
MCHC: 34.1 g/dL (ref 30.0–36.0)
MCV: 85.9 fL (ref 78.0–100.0)
WBC: 17 10*3/uL — ABNORMAL HIGH (ref 4.0–10.5)

## 2011-08-06 LAB — URINALYSIS, ROUTINE W REFLEX MICROSCOPIC
Leukocytes, UA: NEGATIVE
Nitrite: NEGATIVE
Protein, ur: NEGATIVE mg/dL
Urobilinogen, UA: 0.2 mg/dL (ref 0.0–1.0)

## 2011-08-06 MED ORDER — SODIUM CHLORIDE 0.9 % IV SOLN
1000.0000 mL | Freq: Once | INTRAVENOUS | Status: AC
  Administered 2011-08-06: 1000 mL via INTRAVENOUS

## 2011-08-06 MED ORDER — LORAZEPAM 1 MG PO TABS: 1.0000 mg | ORAL_TABLET | Freq: Three times a day (TID) | ORAL | Status: AC | PRN

## 2011-08-06 MED ORDER — IOHEXOL 300 MG/ML  SOLN
80.0000 mL | Freq: Once | INTRAMUSCULAR | Status: AC | PRN
  Administered 2011-08-06: 80 mL via INTRAVENOUS

## 2011-08-06 MED ORDER — HYDROCODONE-ACETAMINOPHEN 5-325 MG PO TABS: 1.0000 | ORAL_TABLET | Freq: Four times a day (QID) | ORAL | Status: AC | PRN

## 2011-08-06 MED ORDER — FOLIC ACID 1 MG PO TABS
1.0000 mg | ORAL_TABLET | Freq: Once | ORAL | Status: AC
  Administered 2011-08-06: 1 mg via ORAL
  Filled 2011-08-06: qty 1

## 2011-08-06 MED ORDER — ONDANSETRON HCL 4 MG/2ML IJ SOLN
INTRAMUSCULAR | Status: AC
  Administered 2011-08-06: 4 mg via INTRAVENOUS
  Filled 2011-08-06: qty 2

## 2011-08-06 MED ORDER — PROMETHAZINE HCL 25 MG PO TABS: 25.0000 mg | ORAL_TABLET | Freq: Four times a day (QID) | ORAL | Status: AC | PRN

## 2011-08-06 MED ORDER — DEXTROSE 50 % IV SOLN
1.0000 | Freq: Once | INTRAVENOUS | Status: AC
  Administered 2011-08-06: 50 mL via INTRAVENOUS
  Filled 2011-08-06: qty 50

## 2011-08-06 MED ORDER — POTASSIUM CHLORIDE 20 MEQ PO PACK
40.0000 meq | PACK | Freq: Once | ORAL | Status: DC
  Filled 2011-08-06: qty 2

## 2011-08-06 MED ORDER — LORAZEPAM 2 MG/ML IJ SOLN: 1.0000 mg | Freq: Once | INTRAMUSCULAR | Status: DC

## 2011-08-06 MED ORDER — ADULT MULTIVITAMIN W/MINERALS CH
1.0000 | ORAL_TABLET | Freq: Once | ORAL | Status: AC
  Administered 2011-08-06: 1 via ORAL
  Filled 2011-08-06: qty 1

## 2011-08-06 MED ORDER — THIAMINE HCL 100 MG/ML IJ SOLN
100.0000 mg | Freq: Once | INTRAMUSCULAR | Status: AC
  Administered 2011-08-06: 100 mg via INTRAVENOUS
  Filled 2011-08-06: qty 2

## 2011-08-06 MED ORDER — HYDROMORPHONE HCL PF 1 MG/ML IJ SOLN
1.0000 mg | Freq: Once | INTRAMUSCULAR | Status: AC
  Administered 2011-08-06: 1 mg via INTRAVENOUS
  Filled 2011-08-06: qty 1

## 2011-08-06 MED ORDER — ONDANSETRON HCL 4 MG/2ML IJ SOLN
4.0000 mg | Freq: Once | INTRAMUSCULAR | Status: AC
  Administered 2011-08-06: 4 mg via INTRAVENOUS

## 2011-08-06 MED ORDER — HYDROMORPHONE HCL PF 1 MG/ML IJ SOLN
INTRAMUSCULAR | Status: AC
  Filled 2011-08-06: qty 1

## 2011-08-06 MED ORDER — POTASSIUM CHLORIDE CRYS ER 20 MEQ PO TBCR
40.0000 meq | EXTENDED_RELEASE_TABLET | Freq: Once | ORAL | Status: AC
  Administered 2011-08-06: 20 meq via ORAL
  Filled 2011-08-06: qty 2

## 2011-08-06 MED ORDER — HYDROMORPHONE HCL PF 1 MG/ML IJ SOLN
1.0000 mg | INTRAMUSCULAR | Status: AC | PRN
  Administered 2011-08-06 (×3): 1 mg via INTRAVENOUS
  Filled 2011-08-06 (×2): qty 1

## 2011-08-06 MED ORDER — HYDROMORPHONE HCL PF 1 MG/ML IJ SOLN
INTRAMUSCULAR | Status: AC
  Administered 2011-08-06: 1 mg via INTRAVENOUS
  Filled 2011-08-06: qty 1

## 2011-08-06 MED ORDER — IOHEXOL 300 MG/ML  SOLN: 20.0000 mL | INTRAMUSCULAR | Status: AC

## 2011-08-06 MED ORDER — ONDANSETRON HCL 4 MG/2ML IJ SOLN
4.0000 mg | Freq: Once | INTRAMUSCULAR | Status: AC
  Administered 2011-08-06: 4 mg via INTRAVENOUS
  Filled 2011-08-06: qty 2

## 2011-08-06 MED ORDER — SODIUM CHLORIDE 0.9 % IV SOLN
1000.0000 mL | INTRAVENOUS | Status: DC
  Administered 2011-08-06: 1000 mL via INTRAVENOUS

## 2011-08-06 MED ORDER — PROMETHAZINE HCL 25 MG/ML IJ SOLN
12.5000 mg | Freq: Once | INTRAMUSCULAR | Status: AC
  Administered 2011-08-06: 12.5 mg via INTRAVENOUS
  Filled 2011-08-06: qty 1

## 2011-08-06 NOTE — ED Notes (Signed)
Pt presents with onset of vomiting and R sided abdominal pain since this morning.
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
## 2011-08-06 NOTE — Discharge Instructions (Signed)
Dehydration, Adult Dehydration is when you lose more fluids from the body than you take in. Vital organs like the kidneys, brain, and heart cannot function without a proper amount of fluids and salt. Any loss of fluids from the body can cause dehydration.  CAUSES   Vomiting.   Diarrhea.   Excessive sweating.   Excessive urine output.   Fever.  SYMPTOMS  Mild dehydration  Thirst.   Dry lips.   Slightly dry mouth.  Moderate dehydration  Very dry mouth.   Sunken eyes.   Skin does not bounce back quickly when lightly pinched and released.   Dark urine and decreased urine production.   Decreased tear production.   Headache.  Severe dehydration  Very dry mouth.   Extreme thirst.   Rapid, weak pulse (more than 100 beats per minute at rest).   Cold hands and feet.   Not able to sweat in spite of heat and temperature.   Rapid breathing.   Blue lips.   Confusion and lethargy.   Difficulty being awakened.   Minimal urine production.   No tears.  DIAGNOSIS  Your caregiver will diagnose dehydration based on your symptoms and your exam. Blood and urine tests will help confirm the diagnosis. The diagnostic evaluation should also identify the cause of dehydration. TREATMENT  Treatment of mild or moderate dehydration can often be done at home by increasing the amount of fluids that you drink. It is best to drink small amounts of fluid more often. Drinking too much at one time can make vomiting worse. Refer to the home care instructions below. Severe dehydration needs to be treated at the hospital where you will probably be given intravenous (IV) fluids that contain water and electrolytes. HOME CARE INSTRUCTIONS   Ask your caregiver about specific rehydration instructions.   Drink enough fluids to keep your urine clear or pale yellow.   Drink small amounts frequently if you have nausea and vomiting.   Eat as you normally do.   Avoid:   Foods or drinks high in  sugar.   Carbonated drinks.   Juice.   Extremely hot or cold fluids.   Drinks with caffeine.   Fatty, greasy foods.   Alcohol.   Tobacco.   Overeating.   Gelatin desserts.   Wash your hands well to avoid spreading bacteria and viruses.   Only take over-the-counter or prescription medicines for pain, discomfort, or fever as directed by your caregiver.   Ask your caregiver if you should continue all prescribed and over-the-counter medicines.   Keep all follow-up appointments with your caregiver.  SEEK MEDICAL CARE IF:  You have abdominal pain and it increases or stays in one area (localizes).   You have a rash, stiff neck, or severe headache.   You are irritable, sleepy, or difficult to awaken.   You are weak, dizzy, or extremely thirsty.  SEEK IMMEDIATE MEDICAL CARE IF:   You are unable to keep fluids down or you get worse despite treatment.   You have frequent episodes of vomiting or diarrhea.   You have blood or green matter (bile) in your vomit.   You have blood in your stool or your stool looks black and tarry.   You have not urinated in 6 to 8 hours, or you have only urinated a small amount of very dark urine.   You have a fever.   You faint.  MAKE SURE YOU:   Understand these instructions.   Will watch your condition.     Will get help right away if you are not doing well or get worse.  Document Released: 05/18/2005 Document Revised: 05/07/2011 Document Reviewed: 01/05/2011 [facility] Patient Information 2012 [place], [place].Metabolic Acidosis Metabolic acidosis happens when the blood becomes too acidic. It can happen in infants, children, and adults for many different reasons. Metabolic acidosis may range from mild to severe, or even life-threatening. CAUSES  Metabolic acidosis happens as a result of one of the following:  The body produces too much acid.   You consume a toxic substance that increases the acid content in the blood.   The body  loses an important element, such as bicarbonate, which helps to balance acid.   The kidneys do not remove enough acid from the body.  There are many causes. Metabolic acidosis can be brought on by chronic or life-threatening conditions, such as:  Diabetes.   Kidney disease or failure.   Liver failure.   Seizures.   Diarrhea.   Gastrointestinal conditions (or gastrointestinal surgery).   Severe dehydration.   Cancer.   Shock.   Severe infections that affect the whole body (systemic).   Anemia.   Heart failure.   Exposure or ingestion of poisons or toxic substances, such as:   Carbon monoxide, cyanide, ethylene glycol (antifreeze), or methanol.   Excessive use of supplements, including iron and performance-enhancing drinks.   Medicine, such as:   Aspirin.   Acetazolamide.   Lifestyle factors, such as:   Excess alcohol.   Prolonged intense exercise without proper rest and rehydration.   Malnutrition or fasting.  SYMPTOMS Mild acidosis may not cause symptoms. More severe acidosis may cause:  Rapid breathing.   Feeling sick to your stomach (nauseous).   Throwing up (vomiting).   Headache.   Confusion.   Fatigue.   Weakness.   Altered level of consciousness.  DIAGNOSIS Diagnosis is made by:  Physical exam and patient history.   Blood tests. Sometimes, an arterial blood gas test (ABG) is needed. The blood is taken from an artery in the wrist. This test can measure the specific pH of the blood.   Urine tests.  TREATMENT Severe forms of metabolic acidosis require hospital treatment and inpatient care. Treatment depends on the cause and severity of the metabolic acidosis. Treatment is aimed at restoring balance of acid (normal pH) in the blood and treating the underlying cause. Treatment may include fluid support, administration of sodium bicarbonate to neutralize the acids, or management of kidney disease, diabetes, infections, or other underlying  conditions. PROGNOSIS Severe cases of metabolic acidosis require treatment since it can lead to shock or even death. There is a very good chance of correcting metabolic acidosis when the problem that caused it is successfully treated. The earlier the acidosis and cause are found, the more likely the treatment will be successful. PREVENTION To prevent metabolic acidosis in the future, follow these guidelines:  Manage chronic conditions. Take your medicines as directed. Failure to take certain medicines, or taking too much of them, can predispose you to acidosis.   Avoid exposure to poisons and toxins.   Limit alcohol intake.   Drink enough water and fluids to keep your urine clear or pale yellow.   Understand your medicines and supplements, and their possible side effects.  HOME CARE INSTRUCTIONS  Continue taking your medicines as directed.   Take any new medicines prescribed by your caregiver. You may be given antibiotic medicine if you have an infection.   Maintain good hydration and nutrition.   If you  get dialysis, do not miss any appointments.   Get plenty of rest.   See your caregiver for close follow-up. You may need more blood tests.  SEEK IMMEDIATE MEDICAL CARE IF:  You have shortness of breath, chest pain, or a fast heartbeat (palpitations).   You develop worsening nausea, vomiting, or uncontrolled diarrhea.   You develop worsening body aches, fatigue, or lethargy.   You experience worsening levels of consciousness or you faint.   You develop worsening signs of dehydration, you cannot eat, or you are producing less urine.   You have uncontrolled pain in your joints and limbs.   You have uncontrolled, severe abdominal pain.  Document Released: 09/02/2010 Document Revised: 05/07/2011 Document Reviewed: 09/02/2010 [facility] Patient Information 2012 [place], [place]. RESOURCE GUIDE  Dental Problems  Patients with Medicaid: [facility]                      [phone] [address]                                           Phone:  [phone]                                                  If unable to pay or uninsured, contact:  [facility] or [facility] to become qualified for the adult dental clinic.  Chronic Pain Problems Contact [facility]  [phone] Patients need to be referred by their primary care doctor.  Insufficient Money for Medicine Contact [employer]:  call "211" or [facility] [phone].  No Primary Care Doctor Call [facility]  [phone] Other agencies that provide inexpensive medical care    [facility]  [phone]    [facility]  [phone]    [facility]  [phone]    [facility]  [phone]    [facility]  [phone]    [facility]  [phone]  Substance Abuse Resources [facility]  [phone] [facility] [phone] [facility] [place] [phone] Floydene Flock [phone] Residential & Outpatient Substance Abuse Program  4425926272  Psychological Services [facility]  [phone] [facility]  [phone] [facility] Mental Health   [phone] (emergency services [phone])  Abuse/Neglect [facility] Child Abuse Hotline [phone] [facility] Child Abuse Hotline 7327785301 (After Hours)  Emergency Shelter [facility] Ministries (443)[phone]  M[facility] Homes [facility] 4130745832 [facility] [phone]  MRSA Hotline #:   562-857-6941    [facility] Resources  [facility] [place]  [employer]                           [facility] Dept. [address] [place]                     [address]         [address]  [address]  Cristobal Goldmann Phone:  [phone]                                   Phone:  [phone]                   Phone:  [phone]  [facility] Mental Health Phone:  [phone]  [facility] Child Abuse Hotline [phone] (After Hours)Alcohol Problems Most adults who drink alcohol drink in moderation (not a lot) are at low risk for developing problems related to their drinking. However, all drinkers, including low-risk drinkers, should know about the health risks connected with drinking alcohol. RECOMMENDATIONS FOR LOW-RISK DRINKING  Drink in moderation. Moderate drinking is defined as follows:   Men - no more than 2 drinks per day.   Nonpregnant women - no more than 1 drink per day.   Over age 24 - no more than 1 drink per day.  A standard drink is 12 grams of pure alcohol, which is equal to a 12 ounce bottle of beer or wine cooler, a 5 ounce glass of wine, or 1.5 ounces of distilled spirits (such as whiskey, brandy, vodka, or rum).  ABSTAIN FROM (DO NOT DRINK) ALCOHOL:  When pregnant or considering pregnancy.   When taking a medication that interacts with alcohol.   If you are alcohol dependent.   A medical condition that prohibits drinking alcohol (such as ulcer, liver disease, or heart disease).  DISCUSS WITH YOUR CAREGIVER:  If you are at risk for coronary heart disease, discuss the potential benefits and risks of alcohol use: Light to moderate drinking is associated with lower rates of coronary heart disease in certain populations (for example, men over age 51 and postmenopausal women). Infrequent or nondrinkers are advised not to begin light to moderate drinking to reduce the risk of coronary heart disease so as to avoid creating an alcohol-related problem. Similar protective effects can likely be gained through proper diet and exercise.   Women and the elderly have smaller amounts of body water than men. As a result women and the elderly achieve a higher blood alcohol concentration after drinking the same  amount of alcohol.   Exposing a fetus to alcohol can cause a broad range of birth defects referred to as Fetal Alcohol Syndrome (FAS) or Alcohol-Related Birth Defects (ARBD). Although FAS/ARBD is connected with excessive alcohol consumption during pregnancy, studies also have reported neurobehavioral problems in infants born to mothers reporting drinking an average of 1 drink per day during pregnancy.   Heavier drinking (the consumption of more than 4 drinks per occasion by men and more than 3 drinks per occasion by women) impairs learning (cognitive) and psychomotor functions and increases the risk of alcohol-related problems, including accidents and injuries.  CAGE QUESTIONS:   Have you ever felt that you should Cut down on your drinking?   Have people Annoyed you by criticizing your drinking?   Have you ever felt bad or Guilty about your drinking?   Have you ever had a drink first thing in the morning to steady your nerves or get rid of a hangover (Eye opener)?  If you answered positively to any of these questions: You may be at risk for alcohol-related problems if alcohol consumption is:  Men: Greater than 14 drinks per week or more than 4 drinks per occasion.   Women: Greater than 7 drinks per week or more than 3 drinks per occasion.  Do you or your family have a medical history of alcohol-related problems, such as:  Blackouts.   Sexual dysfunction.   Depression.   Trauma.   Liver dysfunction.   Sleep disorders.   Hypertension.   Chronic abdominal pain.   Has your drinking ever caused you problems, such as problems with your family, problems with your work (or school) performance, or accidents/injuries?   Do you have a compulsion to drink or a preoccupation with drinking?   Do you have poor control or are you unable to stop drinking once you have started?   Do you have to drink to avoid withdrawal symptoms?   Do you have problems with withdrawal such as tremors,  nausea, sweats, or mood disturbances?   Does it take more alcohol than in the past to get you high?   Do you feel a strong urge to drink?   Do you change your plans so that you can have a drink?   Do you ever drink in the morning to relieve the shakes or a hangover?  If you have answered a number of the previous questions positively, it may be time for you to talk to your caregivers, family, and friends and see if they think you have a problem. Alcoholism is a chemical dependency that keeps getting worse and will eventually destroy your health and relationships. Many alcoholics end up dead, impoverished, or in prison. This is often the end result of all chemical dependency.  Do not be discouraged if you are not ready to take action immediately.   Decisions to change behavior often involve up and down desires to change and feeling like you cannot decide.   Try to think more seriously about your drinking behavior.   Think of the reasons to quit.  WHERE TO GO FOR ADDITIONAL INFORMATION   The National Institute on Alcohol Abuse and Alcoholism (NIAAA)www.niaaa.nih.gov   [employer] on Alcoholism and Drug Dependence (NCADD)www.ncadd.org   American Society of Addiction Medicine (ASAM)www.https://anderson-johnson.com/  Document Released: 05/18/2005 Document Revised: 05/07/2011 Document Reviewed: 01/04/2008 [facility] Patient Information 2012 [place], [place].

## 2011-08-25 IMAGING — CR DG HAND COMPLETE 3+V*R*
3 series · 3 of 3 positions shown · non-contrast
Comparison: None

CLINICAL DATA: Pain, laceration

RIGHT HAND - COMPLETE 3+ VIEW

[x hand pa right]
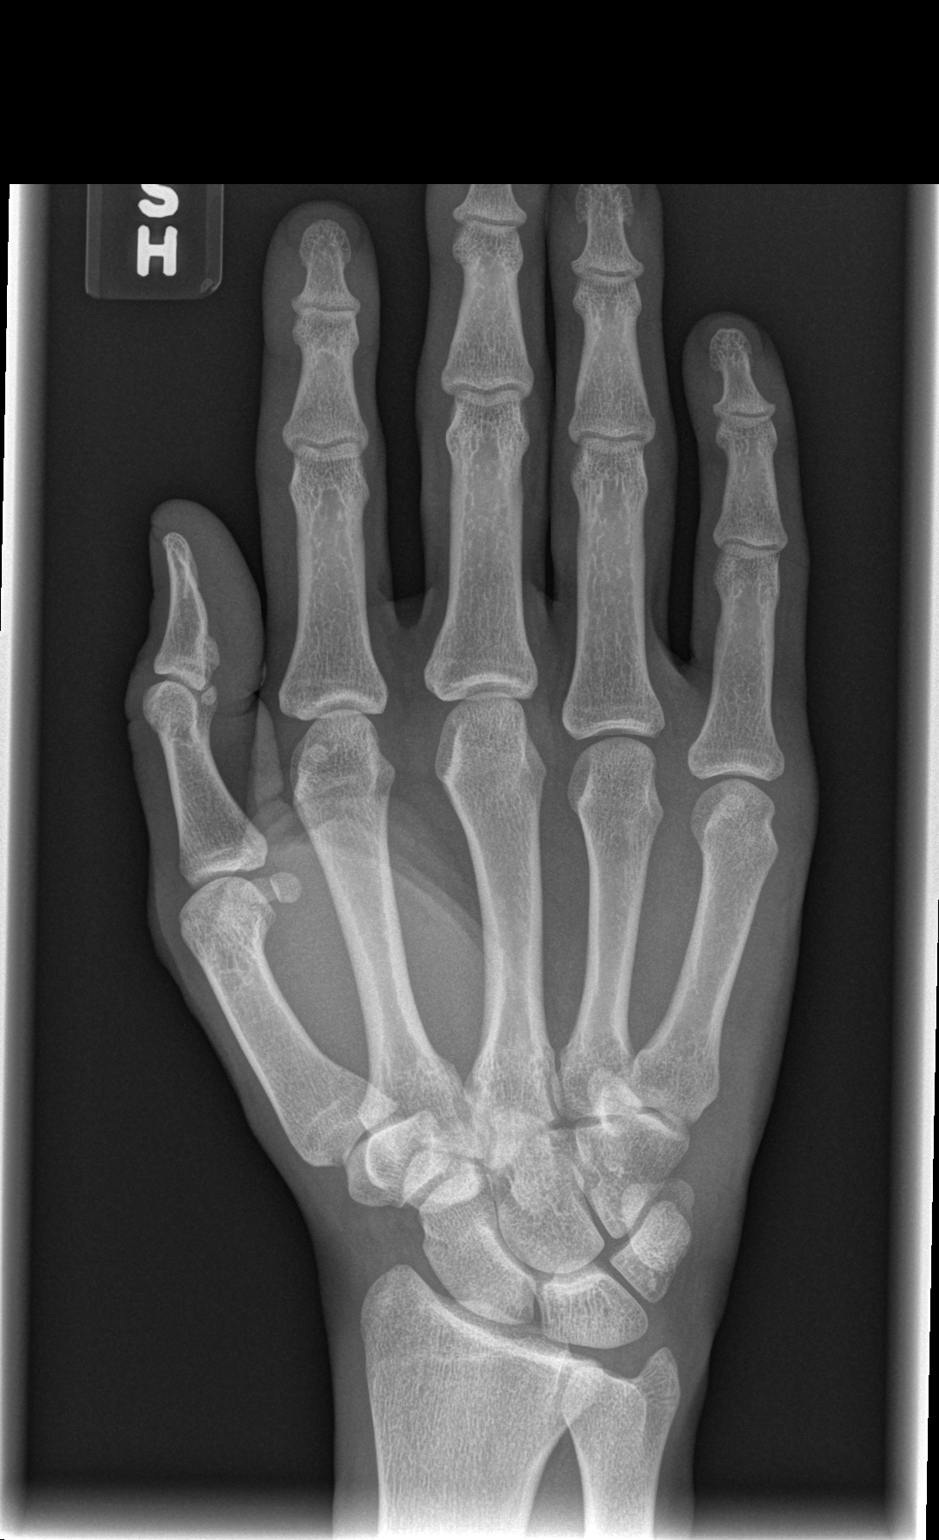

[x hand oblique right]
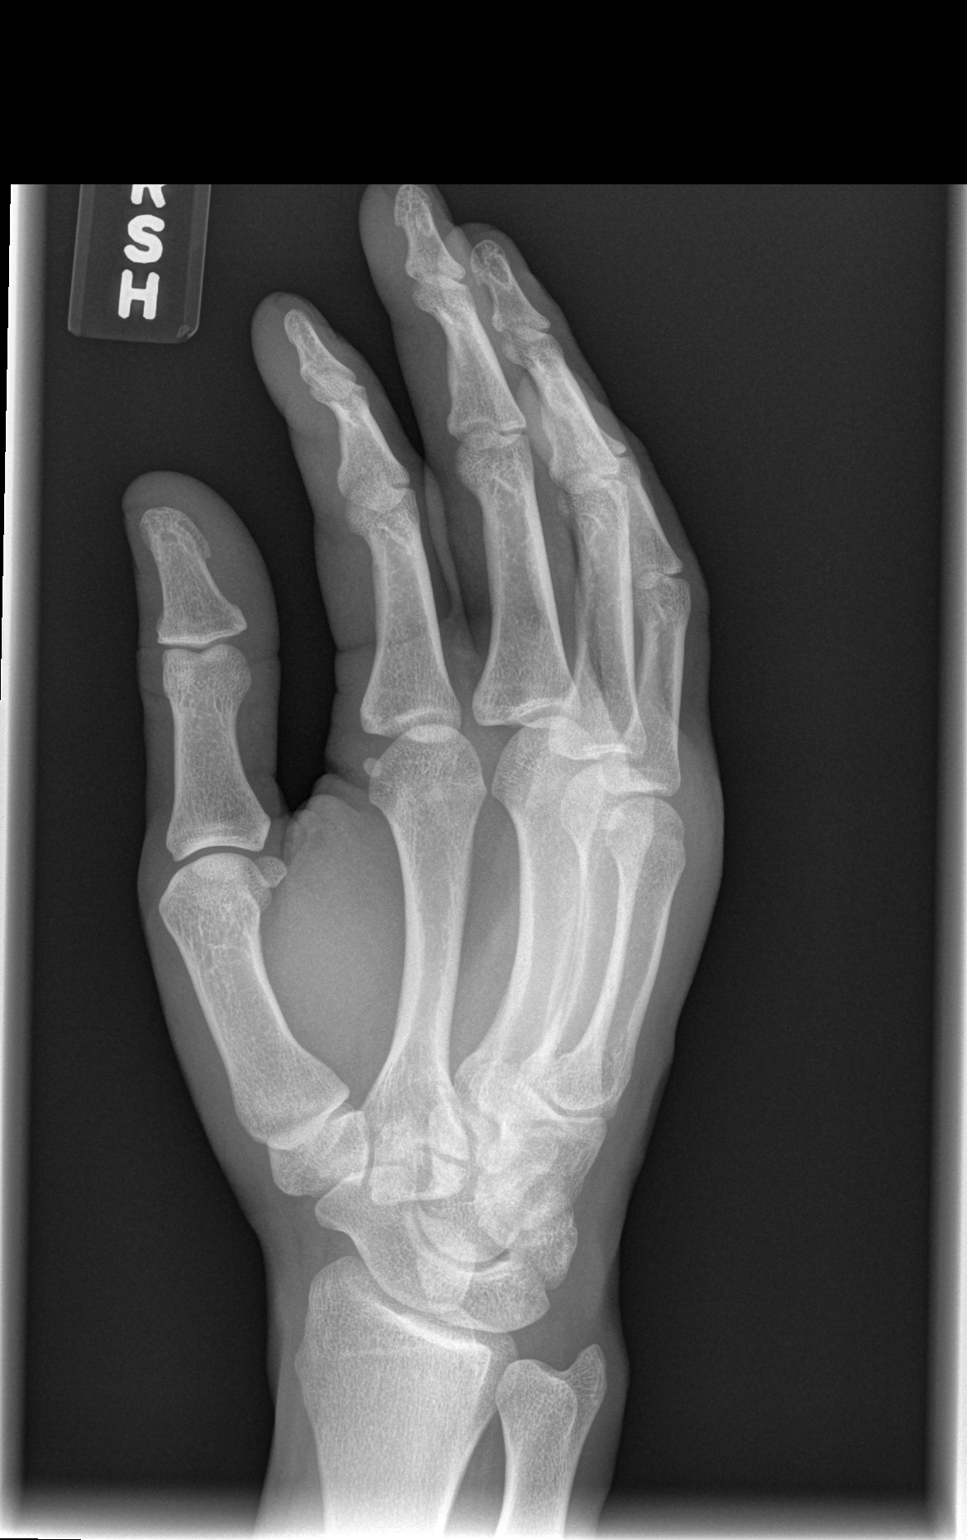

[x hand lat right]
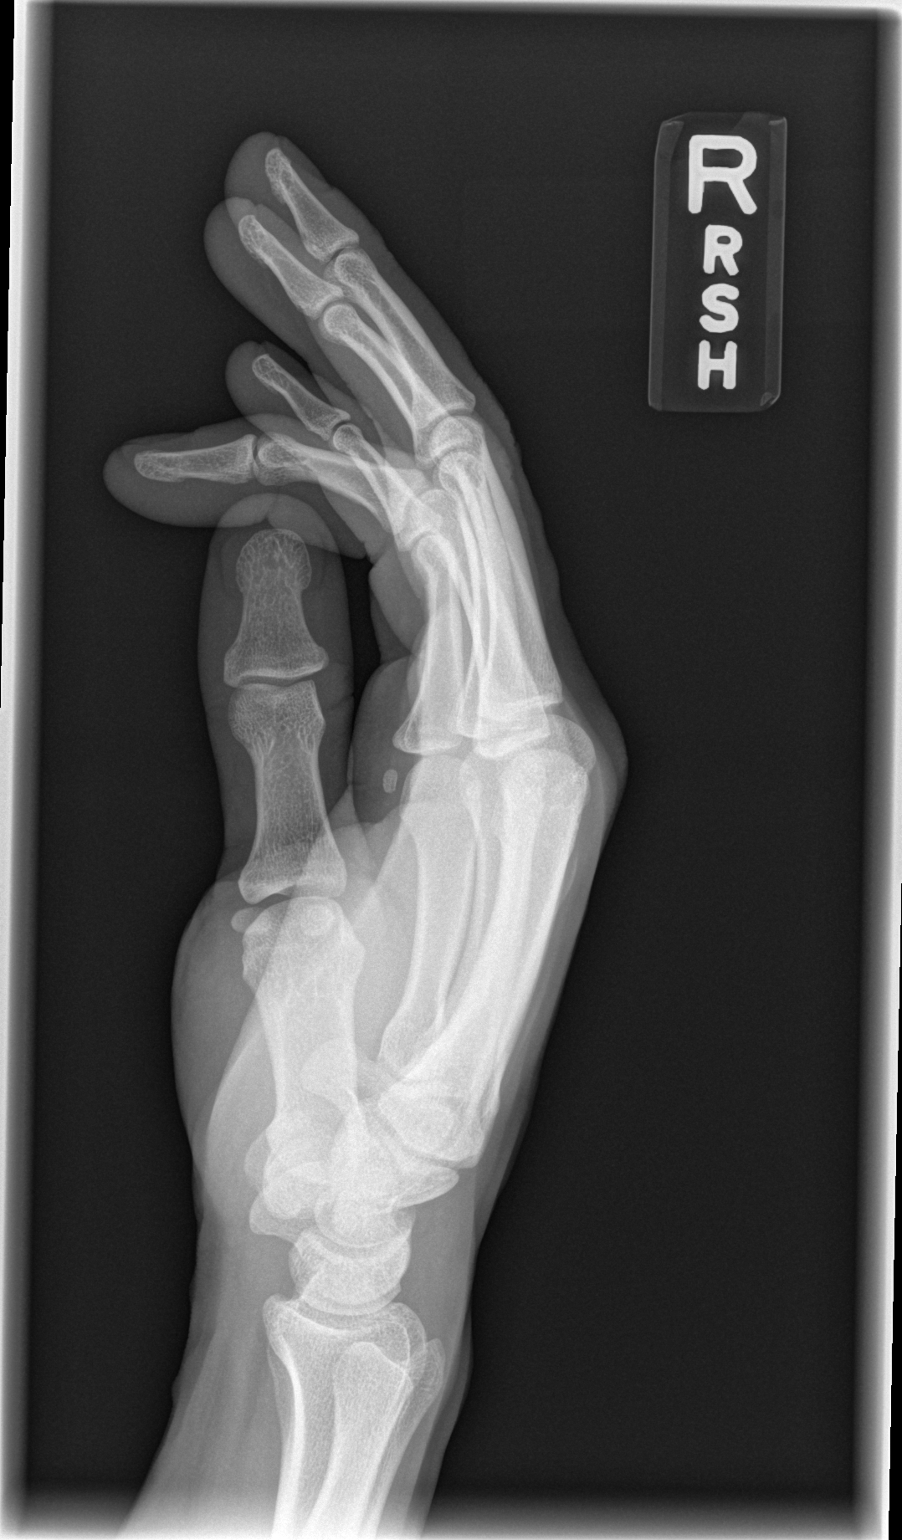

[3 of 3 positions shown; findings below may reference images not displayed]

FINDINGS: Three views of the right hand submitted.  No acute
fracture or subluxation.  No radiopaque foreign body.
IMPRESSION: No acute fracture or subluxation.
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
## 2012-12-04 ENCOUNTER — Emergency Department (HOSPITAL_COMMUNITY)
Admission: EM | Admit: 2012-12-04 | Discharge: 2012-12-04 | Disposition: A | Discharge status: Home / Self Care | Payer: Self-pay | Attending: Emergency Medicine | Admitting: Emergency Medicine

## 2012-12-04 ENCOUNTER — Encounter (HOSPITAL_COMMUNITY): Payer: Self-pay | Admitting: Emergency Medicine

## 2012-12-04 DIAGNOSIS — S39011A Strain of muscle, fascia and tendon of abdomen, initial encounter: Secondary | ICD-10-CM

## 2012-12-04 DIAGNOSIS — F17200 Nicotine dependence, unspecified, uncomplicated: Secondary | ICD-10-CM | POA: Insufficient documentation

## 2012-12-04 DIAGNOSIS — Z87828 Personal history of other (healed) physical injury and trauma: Secondary | ICD-10-CM | POA: Insufficient documentation

## 2012-12-04 DIAGNOSIS — Z9889 Other specified postprocedural states: Secondary | ICD-10-CM | POA: Insufficient documentation

## 2012-12-04 DIAGNOSIS — IMO0002 Reserved for concepts with insufficient information to code with codable children: Secondary | ICD-10-CM | POA: Insufficient documentation

## 2012-12-04 DIAGNOSIS — Z933 Colostomy status: Secondary | ICD-10-CM | POA: Insufficient documentation

## 2012-12-04 DIAGNOSIS — Y9389 Activity, other specified: Secondary | ICD-10-CM | POA: Insufficient documentation

## 2012-12-04 DIAGNOSIS — X503XXA Overexertion from repetitive movements, initial encounter: Secondary | ICD-10-CM | POA: Insufficient documentation

## 2012-12-04 DIAGNOSIS — Y929 Unspecified place or not applicable: Secondary | ICD-10-CM | POA: Insufficient documentation

## 2012-12-04 LAB — HEPATIC FUNCTION PANEL
ALT: 14 U/L (ref 0–53)
AST: 24 U/L (ref 0–37)
Albumin: 3.9 g/dL (ref 3.5–5.2)
Alkaline Phosphatase: 65 U/L (ref 39–117)
Total Bilirubin: 0.3 mg/dL (ref 0.3–1.2)
Total Protein: 7.4 g/dL (ref 6.0–8.3)

## 2012-12-04 LAB — CBC WITH DIFFERENTIAL/PLATELET
Basophils Relative: 1 % (ref 0–1)
HCT: 39.9 % (ref 39.0–52.0)
Hemoglobin: 13.4 g/dL (ref 13.0–17.0)
Lymphocytes Relative: 19 % (ref 12–46)
Lymphs Abs: 1.7 10*3/uL (ref 0.7–4.0)
MCHC: 33.6 g/dL (ref 30.0–36.0)
Monocytes Absolute: 0.9 10*3/uL (ref 0.1–1.0)
Monocytes Relative: 10 % (ref 3–12)
Neutro Abs: 5.8 10*3/uL (ref 1.7–7.7)
Neutrophils Relative %: 66 % (ref 43–77)
RBC: 4.62 MIL/uL (ref 4.22–5.81)

## 2012-12-04 LAB — POCT I-STAT, CHEM 8
Chloride: 103 mEq/L (ref 96–112)
Creatinine, Ser: 1.1 mg/dL (ref 0.50–1.35)
Glucose, Bld: 93 mg/dL (ref 70–99)
HCT: 44 % (ref 39.0–52.0)
Potassium: 3.9 mEq/L (ref 3.5–5.1)
Sodium: 142 mEq/L (ref 135–145)

## 2012-12-04 LAB — URINALYSIS, ROUTINE W REFLEX MICROSCOPIC
Bilirubin Urine: NEGATIVE
Hgb urine dipstick: NEGATIVE
Nitrite: NEGATIVE
Specific Gravity, Urine: 1.024 (ref 1.005–1.030)
pH: 6.5 (ref 5.0–8.0)

## 2012-12-04 MED ORDER — TRAMADOL HCL 50 MG PO TABS: 50.0000 mg | ORAL_TABLET | Freq: Four times a day (QID) | ORAL | Status: DC | PRN

## 2012-12-04 MED ORDER — TRAMADOL HCL 50 MG PO TABS
50.0000 mg | ORAL_TABLET | Freq: Once | ORAL | Status: AC
  Administered 2012-12-04: 50 mg via ORAL
  Filled 2012-12-04: qty 1

## 2012-12-04 MED ORDER — OXYCODONE-ACETAMINOPHEN 5-325 MG PO TABS
2.0000 | ORAL_TABLET | Freq: Once | ORAL | Status: AC
  Administered 2012-12-04: 2 via ORAL
  Filled 2012-12-04: qty 2

## 2012-12-04 NOTE — ED Notes (Addendum)
Pt c/o right low abdominal pain, Flank pain and back pain since June. Pt has colostomy reversal done few years ago. Pt did some heavy lifting in May and was told after the reversal that he cannot do heavy lifting. Pt also reports pain and numbness to right arm and hand x 1 week. Pt denies N/V.

## 2012-12-04 NOTE — ED Provider Notes (Addendum)
Suspect diuresis has led to renal insufficiency and severe dehydration with hyperkalemia and hypotension.  Hurman Horn, MD 12/04/12 2207  Above note entered on wrong chart inadvertantly.  Medical screening examination/treatment/procedure(s) were performed by non-physician practitioner and as supervising physician I was immediately available for consultation/collaboration.[id number]  Hurman Horn, MD 01/24/13 2122
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
## 2013-03-12 ENCOUNTER — Emergency Department (HOSPITAL_COMMUNITY): Payer: Self-pay

## 2013-03-12 ENCOUNTER — Emergency Department (HOSPITAL_COMMUNITY)
Admission: EM | Admit: 2013-03-12 | Discharge: 2013-03-12 | Disposition: A | Discharge status: Home / Self Care | Payer: Self-pay | Attending: Emergency Medicine | Admitting: Emergency Medicine

## 2013-03-12 ENCOUNTER — Encounter (HOSPITAL_COMMUNITY): Payer: Self-pay | Admitting: Emergency Medicine

## 2013-03-12 DIAGNOSIS — W230XXA Caught, crushed, jammed, or pinched between moving objects, initial encounter: Secondary | ICD-10-CM | POA: Insufficient documentation

## 2013-03-12 DIAGNOSIS — F17200 Nicotine dependence, unspecified, uncomplicated: Secondary | ICD-10-CM | POA: Insufficient documentation

## 2013-03-12 DIAGNOSIS — IMO0002 Reserved for concepts with insufficient information to code with codable children: Secondary | ICD-10-CM | POA: Insufficient documentation

## 2013-03-12 DIAGNOSIS — Y929 Unspecified place or not applicable: Secondary | ICD-10-CM | POA: Insufficient documentation

## 2013-03-12 DIAGNOSIS — Y939 Activity, unspecified: Secondary | ICD-10-CM | POA: Insufficient documentation

## 2013-03-12 DIAGNOSIS — S60419A Abrasion of unspecified finger, initial encounter: Secondary | ICD-10-CM

## 2013-03-12 DIAGNOSIS — S6710XA Crushing injury of unspecified finger(s), initial encounter: Secondary | ICD-10-CM | POA: Insufficient documentation

## 2013-03-12 DIAGNOSIS — S6721XA Crushing injury of right hand, initial encounter: Secondary | ICD-10-CM

## 2013-03-12 MED ORDER — HYDROCODONE-ACETAMINOPHEN 5-325 MG PO TABS: 1.0000 | ORAL_TABLET | ORAL | Status: DC | PRN
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
## 2013-03-12 NOTE — ED Provider Notes (Signed)
Medical screening examination/treatment/procedure(s) were performed by non-physician practitioner and as supervising physician I was immediately available for consultation/collaboration.   Julie Manly, MD 03/12/13 2338 

## 2013-08-17 ENCOUNTER — Emergency Department (HOSPITAL_COMMUNITY): Payer: Self-pay

## 2013-08-17 ENCOUNTER — Encounter (HOSPITAL_COMMUNITY): Payer: Self-pay | Admitting: Emergency Medicine

## 2013-08-17 ENCOUNTER — Emergency Department (HOSPITAL_COMMUNITY)
Admission: EM | Admit: 2013-08-17 | Discharge: 2013-08-17 | Discharge status: Against Medical Advice | Payer: Self-pay | Attending: Emergency Medicine | Admitting: Emergency Medicine

## 2013-08-17 DIAGNOSIS — Y929 Unspecified place or not applicable: Secondary | ICD-10-CM | POA: Insufficient documentation

## 2013-08-17 DIAGNOSIS — R109 Unspecified abdominal pain: Secondary | ICD-10-CM

## 2013-08-17 DIAGNOSIS — Z933 Colostomy status: Secondary | ICD-10-CM | POA: Insufficient documentation

## 2013-08-17 DIAGNOSIS — R197 Diarrhea, unspecified: Secondary | ICD-10-CM | POA: Insufficient documentation

## 2013-08-17 DIAGNOSIS — Z9889 Other specified postprocedural states: Secondary | ICD-10-CM | POA: Insufficient documentation

## 2013-08-17 DIAGNOSIS — X500XXA Overexertion from strenuous movement or load, initial encounter: Secondary | ICD-10-CM | POA: Insufficient documentation

## 2013-08-17 DIAGNOSIS — R1110 Vomiting, unspecified: Secondary | ICD-10-CM | POA: Insufficient documentation

## 2013-08-17 DIAGNOSIS — Y9389 Activity, other specified: Secondary | ICD-10-CM | POA: Insufficient documentation

## 2013-08-17 DIAGNOSIS — F17200 Nicotine dependence, unspecified, uncomplicated: Secondary | ICD-10-CM | POA: Insufficient documentation

## 2013-08-17 DIAGNOSIS — X503XXA Overexertion from repetitive movements, initial encounter: Secondary | ICD-10-CM | POA: Insufficient documentation

## 2013-08-17 DIAGNOSIS — S3981XA Other specified injuries of abdomen, initial encounter: Secondary | ICD-10-CM | POA: Insufficient documentation

## 2013-08-17 LAB — URINALYSIS, ROUTINE W REFLEX MICROSCOPIC
BILIRUBIN URINE: NEGATIVE
Glucose, UA: NEGATIVE mg/dL
HGB URINE DIPSTICK: NEGATIVE
KETONES UR: NEGATIVE mg/dL
Leukocytes, UA: NEGATIVE
NITRITE: NEGATIVE
PH: 7.5 (ref 5.0–8.0)
Protein, ur: NEGATIVE mg/dL
Specific Gravity, Urine: 1.023 (ref 1.005–1.030)
Urobilinogen, UA: 1 mg/dL (ref 0.0–1.0)

## 2013-08-17 LAB — COMPREHENSIVE METABOLIC PANEL
ALBUMIN: 4.2 g/dL (ref 3.5–5.2)
ALK PHOS: 66 U/L (ref 39–117)
ALT: 9 U/L (ref 0–53)
AST: 22 U/L (ref 0–37)
BILIRUBIN TOTAL: 0.7 mg/dL (ref 0.3–1.2)
BUN: 9 mg/dL (ref 6–23)
CHLORIDE: 101 meq/L (ref 96–112)
CO2: 25 mEq/L (ref 19–32)
Calcium: 9.5 mg/dL (ref 8.4–10.5)
Creatinine, Ser: 0.96 mg/dL (ref 0.50–1.35)
GFR calc Af Amer: >90 mL/min (ref >90)
GFR calc non Af Amer: >90 mL/min (ref >90)
GLUCOSE: 84 mg/dL (ref 70–99)
POTASSIUM: 3.9 meq/L (ref 3.7–5.3)
Sodium: 141 mEq/L (ref 137–147)
Total Protein: 7.6 g/dL (ref 6.0–8.3)

## 2013-08-17 LAB — CBC WITH DIFFERENTIAL/PLATELET
BASOS PCT: 0 % (ref 0–1)
Basophils Absolute: 0 10*3/uL (ref 0.0–0.1)
Eosinophils Absolute: 0.3 10*3/uL (ref 0.0–0.7)
Eosinophils Relative: 2 % (ref 0–5)
HCT: 40.4 % (ref 39.0–52.0)
HEMOGLOBIN: 13.9 g/dL (ref 13.0–17.0)
LYMPHS ABS: 0.7 10*3/uL (ref 0.7–4.0)
Lymphocytes Relative: 6 % — ABNORMAL LOW (ref 12–46)
MCH: 30 pg (ref 26.0–34.0)
MCHC: 34.4 g/dL (ref 30.0–36.0)
MCV: 87.3 fL (ref 78.0–100.0)
MONOS PCT: 9 % (ref 3–12)
Monocytes Absolute: 1.2 10*3/uL — ABNORMAL HIGH (ref 0.1–1.0)
NEUTROS ABS: 10.8 10*3/uL — AB (ref 1.7–7.7)
NEUTROS PCT: 83 % — AB (ref 43–77)
Platelets: 236 10*3/uL (ref 150–400)
RBC: 4.63 MIL/uL (ref 4.22–5.81)
RDW: 13.2 % (ref 11.5–15.5)
WBC: 13 10*3/uL — AB (ref 4.0–10.5)

## 2013-08-17 LAB — LIPASE, BLOOD: LIPASE: 19 U/L (ref 11–59)

## 2013-08-17 MED ORDER — ONDANSETRON HCL 4 MG/2ML IJ SOLN
4.0000 mg | Freq: Once | INTRAMUSCULAR | Status: AC
  Administered 2013-08-17: 4 mg via INTRAVENOUS
  Filled 2013-08-17: qty 2

## 2013-08-17 MED ORDER — HYDROMORPHONE HCL PF 1 MG/ML IJ SOLN
1.0000 mg | Freq: Once | INTRAMUSCULAR | Status: AC
  Administered 2013-08-17: 1 mg via INTRAVENOUS
  Filled 2013-08-17: qty 1

## 2013-08-17 MED ORDER — IOHEXOL 300 MG/ML  SOLN
25.0000 mL | INTRAMUSCULAR | Status: AC
  Administered 2013-08-17: 25 mL via ORAL

## 2013-08-17 MED ORDER — ONDANSETRON HCL 4 MG PO TABS: 4.0000 mg | ORAL_TABLET | Freq: Four times a day (QID) | ORAL | Status: DC

## 2013-08-17 MED ORDER — ONDANSETRON 4 MG PO TBDP
8.0000 mg | ORAL_TABLET | Freq: Once | ORAL | Status: DC
  Filled 2013-08-17: qty 2

## 2013-08-17 MED ORDER — ONDANSETRON 4 MG PO TBDP
8.0000 mg | ORAL_TABLET | Freq: Once | ORAL | Status: AC
  Administered 2013-08-17: 8 mg via ORAL
  Filled 2013-08-17: qty 2

## 2013-08-17 NOTE — ED Provider Notes (Signed)
Medical screening examination/treatment/procedure(s) were performed by non-physician practitioner and as supervising physician I was immediately available for consultation/collaboration.   EKG Interpretation None        Dagmar HaitWilliam Zendaya Groseclose, MD 08/17/13 [phone]

## 2013-08-17 NOTE — Discharge Instructions (Signed)
Abdominal Pain, Adult °Many things can cause abdominal pain. Usually, abdominal pain is not caused by a disease and will improve without treatment. It can often be observed and treated at home. Your health care provider will do a physical exam and possibly order blood tests and X-rays to help determine the seriousness of your pain. However, in many cases, more time must pass before a clear cause of the pain can be found. Before that point, your health care provider may not know if you need more testing or further treatment. °HOME CARE INSTRUCTIONS  °Monitor your abdominal pain for any changes. The following actions may help to alleviate any discomfort you are experiencing: °· Only take over-the-counter or prescription medicines as directed by your health care provider. °· Do not take laxatives unless directed to do so by your health care provider. °· Try a clear liquid diet (broth, tea, or water) as directed by your health care provider. Slowly move to a bland diet as tolerated. °SEEK MEDICAL CARE IF: °· You have unexplained abdominal pain. °· You have abdominal pain associated with nausea or diarrhea. °· You have pain when you urinate or have a bowel movement. °· You experience abdominal pain that wakes you in the night. °· You have abdominal pain that is worsened or improved by eating food. °· You have abdominal pain that is worsened with eating fatty foods. °SEEK IMMEDIATE MEDICAL CARE IF:  °· Your pain does not go away within 2 hours. °· You have a fever. °· You keep throwing up (vomiting). °· Your pain is felt only in portions of the abdomen, such as the right side or the left lower portion of the abdomen. °· You pass bloody or black tarry stools. °MAKE SURE YOU: °· Understand these instructions.   °· Will watch your condition.   °· Will get help right away if you are not doing well or get worse.   °Document Released: 02/25/2005 Document Revised: 03/08/2013 Document Reviewed: 01/25/2013 °[employer]® Patient  Information ©2014 [employer]. ° °

## 2013-08-17 NOTE — Discharge Planning (Signed)
[facility] Felicia E, [facility]  Spoke to patient about primary care resources. Patient had medicaid in the past and hasn't been able to re-certify, patient states he will be reapplying soon. Resource guide and my contact information provided for any future questions or concerns.

## 2013-08-17 NOTE — ED Notes (Signed)
CT called for update 
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
## 2013-08-17 NOTE — ED Notes (Signed)
CT arrived to pick up pt. However pt refused to have it completed due to needing to leave to pick up daughter. Pt leaving AMA. Rob, PA at bedside.

## 2013-10-21 ENCOUNTER — Encounter (HOSPITAL_COMMUNITY): Payer: Self-pay | Admitting: Emergency Medicine

## 2013-10-21 ENCOUNTER — Emergency Department (HOSPITAL_COMMUNITY): Payer: Self-pay

## 2013-10-21 ENCOUNTER — Emergency Department (HOSPITAL_COMMUNITY)
Admission: EM | Admit: 2013-10-21 | Discharge: 2013-10-21 | Disposition: A | Discharge status: Home / Self Care | Payer: Self-pay | Attending: Emergency Medicine | Admitting: Emergency Medicine

## 2013-10-21 DIAGNOSIS — R55 Syncope and collapse: Secondary | ICD-10-CM | POA: Insufficient documentation

## 2013-10-21 DIAGNOSIS — R109 Unspecified abdominal pain: Secondary | ICD-10-CM

## 2013-10-21 DIAGNOSIS — Z933 Colostomy status: Secondary | ICD-10-CM | POA: Insufficient documentation

## 2013-10-21 DIAGNOSIS — R112 Nausea with vomiting, unspecified: Secondary | ICD-10-CM | POA: Insufficient documentation

## 2013-10-21 DIAGNOSIS — F17200 Nicotine dependence, unspecified, uncomplicated: Secondary | ICD-10-CM | POA: Insufficient documentation

## 2013-10-21 DIAGNOSIS — R1084 Generalized abdominal pain: Secondary | ICD-10-CM | POA: Insufficient documentation

## 2013-10-21 DIAGNOSIS — Z87828 Personal history of other (healed) physical injury and trauma: Secondary | ICD-10-CM | POA: Insufficient documentation

## 2013-10-21 DIAGNOSIS — R61 Generalized hyperhidrosis: Secondary | ICD-10-CM | POA: Insufficient documentation

## 2013-10-21 HISTORY — DX: Accidental discharge from unspecified firearms or gun, initial encounter: W34.00XA

## 2013-10-21 LAB — CBC WITH DIFFERENTIAL/PLATELET
Basophils Absolute: 0.1 10*3/uL (ref 0.0–0.1)
Basophils Relative: 0 % (ref 0–1)
EOS ABS: 0.3 10*3/uL (ref 0.0–0.7)
EOS PCT: 2 % (ref 0–5)
HCT: 35.3 % — ABNORMAL LOW (ref 39.0–52.0)
Hemoglobin: 11.9 g/dL — ABNORMAL LOW (ref 13.0–17.0)
LYMPHS ABS: 1.6 10*3/uL (ref 0.7–4.0)
LYMPHS PCT: 12 % (ref 12–46)
MCH: 28.9 pg (ref 26.0–34.0)
MCHC: 33.7 g/dL (ref 30.0–36.0)
MCV: 85.7 fL (ref 78.0–100.0)
MONO ABS: 1 10*3/uL (ref 0.1–1.0)
Monocytes Relative: 7 % (ref 3–12)
Neutro Abs: 10.4 10*3/uL — ABNORMAL HIGH (ref 1.7–7.7)
Neutrophils Relative %: 79 % — ABNORMAL HIGH (ref 43–77)
PLATELETS: 204 10*3/uL (ref 150–400)
RBC: 4.12 MIL/uL — AB (ref 4.22–5.81)
RDW: 12.6 % (ref 11.5–15.5)
WBC: 13.3 10*3/uL — AB (ref 4.0–10.5)

## 2013-10-21 LAB — RAPID URINE DRUG SCREEN, HOSP PERFORMED
Amphetamines: NOT DETECTED
Barbiturates: NOT DETECTED
Benzodiazepines: NOT DETECTED
COCAINE: POSITIVE — AB
OPIATES: NOT DETECTED
Tetrahydrocannabinol: POSITIVE — AB

## 2013-10-21 LAB — URINALYSIS, ROUTINE W REFLEX MICROSCOPIC
Bilirubin Urine: NEGATIVE
Glucose, UA: NEGATIVE mg/dL
Hgb urine dipstick: NEGATIVE
Ketones, ur: NEGATIVE mg/dL
Leukocytes, UA: NEGATIVE
NITRITE: NEGATIVE
Protein, ur: NEGATIVE mg/dL
SPECIFIC GRAVITY, URINE: 1.029 (ref 1.005–1.030)
UROBILINOGEN UA: 1 mg/dL (ref 0.0–1.0)
pH: 7 (ref 5.0–8.0)

## 2013-10-21 LAB — COMPREHENSIVE METABOLIC PANEL
ALT: 17 U/L (ref 0–53)
AST: 31 U/L (ref 0–37)
Albumin: 3.9 g/dL (ref 3.5–5.2)
Alkaline Phosphatase: 55 U/L (ref 39–117)
BUN: 14 mg/dL (ref 6–23)
CALCIUM: 9.2 mg/dL (ref 8.4–10.5)
CO2: 26 meq/L (ref 19–32)
CREATININE: 1.33 mg/dL (ref 0.50–1.35)
Chloride: 102 mEq/L (ref 96–112)
GFR, EST AFRICAN AMERICAN: 81 mL/min — AB (ref >90)
GFR, EST NON AFRICAN AMERICAN: 69 mL/min — AB (ref >90)
GLUCOSE: 92 mg/dL (ref 70–99)
Potassium: 3.9 mEq/L (ref 3.7–5.3)
Sodium: 140 mEq/L (ref 137–147)
TOTAL PROTEIN: 6.9 g/dL (ref 6.0–8.3)
Total Bilirubin: 0.3 mg/dL (ref 0.3–1.2)

## 2013-10-21 LAB — TROPONIN I: Troponin I: <0.3 ng/mL (ref <0.30)

## 2013-10-21 LAB — ETHANOL: Alcohol, Ethyl (B): <11 mg/dL (ref 0–11)

## 2013-10-21 LAB — LIPASE, BLOOD: LIPASE: 19 U/L (ref 11–59)

## 2013-10-21 MED ORDER — IOHEXOL 300 MG/ML  SOLN
50.0000 mL | Freq: Once | INTRAMUSCULAR | Status: AC | PRN
  Administered 2013-10-21: 50 mL via ORAL

## 2013-10-21 MED ORDER — ONDANSETRON HCL 4 MG PO TABS: 4.0000 mg | ORAL_TABLET | Freq: Four times a day (QID) | ORAL | Status: DC

## 2013-10-21 MED ORDER — IOHEXOL 300 MG/ML  SOLN
100.0000 mL | Freq: Once | INTRAMUSCULAR | Status: AC | PRN
  Administered 2013-10-21: 100 mL via INTRAVENOUS

## 2013-10-21 MED ORDER — SODIUM CHLORIDE 0.9 % IV BOLUS (SEPSIS)
1000.0000 mL | Freq: Once | INTRAVENOUS | Status: AC
  Administered 2013-10-21: 1000 mL via INTRAVENOUS

## 2013-10-21 MED ORDER — ONDANSETRON HCL 4 MG/2ML IJ SOLN
4.0000 mg | Freq: Once | INTRAMUSCULAR | Status: AC
  Administered 2013-10-21: 4 mg via INTRAVENOUS
  Filled 2013-10-21: qty 2

## 2013-10-21 MED ORDER — HYDROMORPHONE HCL PF 1 MG/ML IJ SOLN
0.5000 mg | Freq: Once | INTRAMUSCULAR | Status: AC
  Administered 2013-10-21: 0.5 mg via INTRAVENOUS
  Filled 2013-10-21: qty 1
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
## 2013-10-21 NOTE — Discharge Instructions (Signed)
Please call and set up an appointment with health and wellness Center Please call and set up an appointment with gastroenterology regarding ongoing abdominal pain Please rest and stay hydrated Please avoid any physical or shortness activity Please stay hydrated Please continue to monitor symptoms closely and if symptoms are to worsen or change (fever greater than 101, chills, chest pain, shortness of breath, difficulty breathing, numbness, tingling, worsening or changes to abdominal pain, inability to keep food or fluids down, decreased flatulence, blood in the stools, black tarry stools, fainting, weakness, blood in the urine, decreased urination) please report back to the ED immediately   Abdominal Pain, Adult Many things can cause abdominal pain. Usually, abdominal pain is not caused by a disease and will improve without treatment. It can often be observed and treated at home. Your health care provider will do a physical exam and possibly order blood tests and X-rays to help determine the seriousness of your pain. However, in many cases, more time must pass before a clear cause of the pain can be found. Before that point, your health care provider may not know if you need more testing or further treatment. HOME CARE INSTRUCTIONS  Monitor your abdominal pain for any changes. The following actions may help to alleviate any discomfort you are experiencing:  Only take over-the-counter or prescription medicines as directed by your health care provider.  Do not take laxatives unless directed to do so by your health care provider.  Try a clear liquid diet (broth, tea, or water) as directed by your health care provider. Slowly move to a bland diet as tolerated. SEEK MEDICAL CARE IF:  You have unexplained abdominal pain.  You have abdominal pain associated with nausea or diarrhea.  You have pain when you urinate or have a bowel movement.  You experience abdominal pain that wakes you in the  night.  You have abdominal pain that is worsened or improved by eating food.  You have abdominal pain that is worsened with eating fatty foods. SEEK IMMEDIATE MEDICAL CARE IF:   Your pain does not go away within 2 hours.  You have a fever.  You keep throwing up (vomiting).  Your pain is felt only in portions of the abdomen, such as the right side or the left lower portion of the abdomen.  You pass bloody or black tarry stools. MAKE SURE YOU:  Understand these instructions.   Will watch your condition.   Will get help right away if you are not doing well or get worse.  Document Released: 02/25/2005 Document Revised: 03/08/2013 Document Reviewed: 01/25/2013 [facility] Patient Information 2014 [employer] [place].   Emergency Department Resource Guide 1) Find a Doctor and Pay Out of Pocket Although you won't have to find out who is covered by your insurance plan, it is a good idea to ask around and get recommendations. You will then need to call the office and see if the doctor you have chosen will accept you as a new patient and what types of options they offer for patients who are self-pay. Some doctors offer discounts or will set up payment plans for their patients who do not have insurance, but you will need to ask so you aren't surprised when you get to your appointment.  2) Contact Your Local Health Department Not all health departments have doctors that can see patients for sick visits, but many do, so it is worth a call to see if yours does. If you don't know where your local health  department is, you can check in your phone book. The CDC also has a tool to help you locate your state's health department, and many state websites also have listings of all of their local health departments.  3) Find a Walk-in Clinic If your illness is not likely to be very severe or complicated, you may want to try a walk in clinic. These are popping up all over the country in pharmacies,  drugstores, and shopping centers. They're usually staffed by nurse practitioners or physician assistants that have been trained to treat common illnesses and complaints. They're usually fairly quick and inexpensive. However, if you have serious medical issues or chronic medical problems, these are probably not your best option.  No Primary Care Doctor: - Call Health Connect at  [phone] - they can help you locate a primary care doctor that  accepts your insurance, provides certain services, etc. - Physician Referral Service- [phone]  Chronic Pain Problems: Organization         Address  Phone   Notes  [facility]  [phone] Patients need to be referred by their primary care doctor.   Medication Assistance: Organization         Address  Phone   Notes  [facility] Medication [facility] [address] [place] [place], [place] [address] [phone] --Must be a resident of [facility] -- Must have NO insurance coverage whatsoever (no Medicaid/ Medicare, etc.) -- The pt. MUST have a primary care doctor that directs their care regularly and follows them in the community   [facility]  [phone]   [employer]  [phone]    Agencies that provide inexpensive medical care: Organization         Address  Phone   Notes  [facility]  [phone]   [facility]    [phone]   [facility] [address] [place], [place] [address] [phone]   [facility] [address] [place], [place] [phone]   Planned Parenthood    [phone]   [facility]    [phone]   [facility]  [address], [place] Phone:  [phone], Fax:  [phone] Hours of Operation:  9 am - 6 pm, M-F.  Also accepts Medicaid/Medicare and self-pay.  [facility]  [address], [place] Phone:  [phone], Fax: [phone]. Hours of Operation:  8:30 am - 5:30 pm, M-F.  Also accepts Medicaid and self-pay.  [facility] [place] [address], [place] Phone: [phone]   [facility] [address] Natasha Bence [place], [place] [phone], Ext. [phone] Mondays & Thursdays: 7-9 AM.  First 15 patients are seen on a first come, first serve basis.    Medicaid-accepting [facility] Providers:  Organization         Address  Phone   Notes  [facility] [address], [place] [phone] Also accepts self-pay patients.  [facility] [address] Laurell Josephs [place], [place]  [phone]   [facility] [address], [place] [phone]   [facility] [address], [place] [phone]   Renaye Rakers [address], [place]   [phone] Only accepts [place] Access [place] patients after they have  their name applied to their card.   Self-Pay (no insurance) in [facility]:  Organization         Address  Phone   Notes  Sickle Cell Patients, [facility] [address] [place], [place] [phone]   [facility] [address] [place], [place] [phone]   [facility] [place]  [address],  [phone]   [facility]/Dr. Osei-Bonsu  [address] [place] [address], [place] or [address], [place] [phone] Phone number for both [place] and [place] locations is the same.  [facility] [address], [place] [phone]   [facility] [address], [place] or [address] [phone]   [facility] [address], [place] [phone], phone; [phone], fax Sees patients 1st and 3rd Saturday of every month.  Must not qualify for public  or private insurance (i.e. Medicaid, Medicare, [place] Health Choice, Veterans' Benefits)  Household income should be no more than 200% of the poverty level The clinic cannot treat you if you are pregnant or think you are pregnant  Sexually transmitted diseases are not treated at the clinic.    Dental Care: Organization         Address  Phone  Notes  [facility] Department of [facility] [address] [place], [place] [phone] Accepts children up to age 71 who are enrolled in [place] or [place] Health Choice; pregnant women with a Medicaid card; and children who have applied for Medicaid or [place] Health Choice, but were declined, whose parents can pay a reduced fee at time of service.  [facility] Department of [facility]  [address], [place] [phone] Accepts children up to age 44 who are enrolled in [place] or [place] Health Choice; pregnant women with a Medicaid card; and children who have applied for Medicaid or [place] Health Choice, but were declined, whose parents can pay a reduced fee at time of service.  [facility]  [address] [place], [place] [phone] Patients are seen by appointment only. Walk-ins are not accepted. [facility] will see patients 74 years of age and older. Monday - Tuesday (8am-5pm) Most Wednesdays (8:30-5pm) $30 per visit, cash only  [facility]  [address], [facility] [phone] Patients are seen by appointment only. Walk-ins are not accepted. [facility] will see patients 73 years of age and older. One Wednesday Evening (Monthly: Volunteer Based).  $30 per visit, cash only  [employer]  [phone] for adults; Children under age 61, call Graduate Pediatric Dentistry at [phone]. Children aged 61-14, please call [phone] to request a pediatric application.  Dental services are provided in all areas of  dental care including fillings, crowns and bridges, complete and partial dentures, implants, gum treatment, root canals, and extractions. Preventive care is also provided. Treatment is provided to both adults and children. Patients are selected via a lottery and there is often a waiting list.   [facility] [address], [place]  [phone] [email or web address]   [facility] [address] [place], [place] [phone], Ext. [phone] Second and Fourth Thursday of each month, opens at 6:30 AM; Clinic ends at 9 AM.  Patients are seen on  a [employer]	[place] basis, and a limited number are seen during each clinic.   [facility]  [address] Ether Griffins [place], [place] [phone]   Eligibility Requirements You must have lived in [place], [place], or [place] counties for at least the last three months.   You cannot be eligible for state or federal sponsored [place], including [employer], [place], or [employer].   You generally cannot be eligible for healthcare insurance through your employer.    How to apply: Eligibility screenings are held every Tuesday and Wednesday afternoon from 1:00 pm until 4:00 pm. You do not need an appointment for the interview!  [facility] [address] [place], [place] [phone]   [facility] Health Department  [phone]   [facility] Health Department  [phone]   [facility] Health Department  [phone]    Behavioral Health Resources in the Community: Intensive Outpatient Programs Organization         Address  Phone  Notes  [facility] [address] [place], [place] [phone]   [facility] Outpatient [address], [place], [place] [phone]   [facility] [address] [place], [place]  [phone]   [facility] [address]  [place], [place] [phone] or  [phone]   Substance Abuse Resources Organization         Address  Phone  Notes  [facility]  [phone]   [facility]  [phone]   [facility]  [phone]   Floydene Flock  [phone]   Residential & Outpatient Substance Abuse Program  [phone]   Psychological Services Organization         Address  Phone  Notes  [facility] Behavioral Health  [phone]   [facility]  [phone]   [facility] Mental Health [address] [place] [phone] or [phone]    Mobile Crisis Teams Organization         Address  Phone  Notes  [facility], Mobile Crisis Care Unit  [phone]   [facility]  [address] [place], [place] [phone]   Doristine Locks [address] [place] [phone]    Self-Help/Support Groups Organization         Address  Phone             Notes  [facility] [place] - variety of support groups  336- I7437963 Call for more information  Narcotics Anonymous (NA), [facility] [address], [employer]   2 meetings at this location   Statistician         Address  Phone  Notes  [facility] [address],    [place]  [phone]   [facility]  [address] [place] [address], [place], [place] [phone]   [facility] [address] [place], [place] [phone] Admissions: 8am-3pm M-F  [facility] [address]    [place], [place] [phone]   [facility] [address] Starling Manns [place], [place] [phone]   [facility] [address],  [place], [place] [phone]   [facility] - Intensive Outpatient [address] Laurell Joseph[address], [place], [place] [phone]   [facility] ([facility] [address] [place]  [place], [place] [phone] or [phone]   Residential  Treatment Services (RTS) [address], [place], [place]  [place] Medicaid  [facility] [address]  [place] [phone] Substance Abuse/Addiction Treatment   [facility] Organization         Address  Phone  Notes  [facility]  [phone]   Domenic Schwab, PhD [address] Arlis Porta [place], [place]   [phone] or [phone]   [place], [place] [phone]   [address] [place], [place] [phone] Insurance/Medicaid/sponsorship through [facility] and Families [address] [place]                                    [place], [place] [phone] [place] [address], [place] [phone]    Dr. Adele Schilder  [phone]   [facility] Dept. 1) [address] [place] [address], [place] 2) West 3)  [address], [place] [phone]  [phone]   [place] [phone] or [phone] (After Hours)

## 2013-10-21 NOTE — ED Notes (Signed)
Patient is alert and oriented x3.  He is being seen for a syncopal episode today that  Was witnessed by co workers.  Patient states that he had a sharp pain in his abdomen  And then passed out.  He continues to have the intermittent pain in his abdomen that he currently  Rates 8 of 10.
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
## 2014-04-01 ENCOUNTER — Encounter (HOSPITAL_COMMUNITY): Payer: Self-pay | Admitting: *Deleted

## 2014-04-01 ENCOUNTER — Emergency Department (HOSPITAL_COMMUNITY)
Admission: EM | Admit: 2014-04-01 | Discharge: 2014-04-02 | Disposition: A | Discharge status: Home / Self Care | Payer: Self-pay | Attending: Emergency Medicine | Admitting: Emergency Medicine

## 2014-04-01 DIAGNOSIS — H53149 Visual discomfort, unspecified: Secondary | ICD-10-CM | POA: Insufficient documentation

## 2014-04-01 DIAGNOSIS — R519 Headache, unspecified: Secondary | ICD-10-CM

## 2014-04-01 DIAGNOSIS — Z87828 Personal history of other (healed) physical injury and trauma: Secondary | ICD-10-CM | POA: Insufficient documentation

## 2014-04-01 DIAGNOSIS — R51 Headache: Secondary | ICD-10-CM | POA: Insufficient documentation

## 2014-04-01 DIAGNOSIS — Z720 Tobacco use: Secondary | ICD-10-CM | POA: Insufficient documentation

## 2014-04-01 NOTE — ED Notes (Signed)
Pt denies any recent head injury/trauma. Pt reports some nausea, reports visual spots

## 2014-04-01 NOTE — ED Notes (Signed)
Pt c/o HA for three weeks. Pt reports hx of migraines. Pt is not getting any relief from OTC medications.

## 2014-04-02 ENCOUNTER — Emergency Department (HOSPITAL_COMMUNITY): Payer: Self-pay

## 2014-04-02 MED ORDER — DIPHENHYDRAMINE HCL 50 MG/ML IJ SOLN
25.0000 mg | Freq: Once | INTRAMUSCULAR | Status: AC
  Administered 2014-04-02: 25 mg via INTRAVENOUS
  Filled 2014-04-02: qty 1

## 2014-04-02 MED ORDER — METOCLOPRAMIDE HCL 5 MG/ML IJ SOLN
10.0000 mg | Freq: Once | INTRAMUSCULAR | Status: AC
  Administered 2014-04-02: 10 mg via INTRAVENOUS
  Filled 2014-04-02: qty 2

## 2014-04-02 MED ORDER — IBUPROFEN 600 MG PO TABS: 600.0000 mg | ORAL_TABLET | Freq: Four times a day (QID) | ORAL | Status: DC | PRN

## 2014-04-02 MED ORDER — KETOROLAC TROMETHAMINE 30 MG/ML IJ SOLN
30.0000 mg | Freq: Once | INTRAMUSCULAR | Status: AC
  Administered 2014-04-02: 30 mg via INTRAVENOUS
  Filled 2014-04-02: qty 1

## 2014-04-02 MED ORDER — SODIUM CHLORIDE 0.9 % IV BOLUS (SEPSIS)
1000.0000 mL | Freq: Once | INTRAVENOUS | Status: AC
  Administered 2014-04-02: 1000 mL via INTRAVENOUS

## 2014-04-02 MED ORDER — FENTANYL CITRATE 0.05 MG/ML IJ SOLN
50.0000 ug | Freq: Once | INTRAMUSCULAR | Status: AC
  Administered 2014-04-02: 50 ug via INTRAVENOUS
  Filled 2014-04-02: qty 2

## 2014-04-02 NOTE — Discharge Instructions (Signed)
We saw you in the ER for headaches. All the labs and imaging are normal. We are not sure what is causing your headaches, however, there appears to be no evidence of infection, bleeds or tumors based on our exam and results. There are some patters making [place] suspicious of possible migraines.  Please take motrin round the clock for the next 6 hours.  Please return to the ER if your symptoms worsen; you have increased pain, fevers, chills, inability to keep any medications down, confusion. Otherwise see the outpatient doctor as requested.   General Headache Without Cause A headache is pain or discomfort felt around the head or neck area. The specific cause of a headache may not be found. There are many causes and types of headaches. A few common ones are:  Tension headaches.  Migraine headaches.  Cluster headaches.  Chronic daily headaches. HOME CARE INSTRUCTIONS   Keep all follow-up appointments with your caregiver or any specialist referral.  Only take over-the-counter or prescription medicines for pain or discomfort as directed by your caregiver.  Lie down in a dark, quiet room when you have a headache.  Keep a headache journal to find out what may trigger your migraine headaches. For example, write down:  What you eat and drink.  How much sleep you get.  Any change to your diet or medicines.  Try massage or other relaxation techniques.  Put ice packs or heat on the head and neck. Use these 3 to 4 times per day for 15 to 20 minutes each time, or as needed.  Limit stress.  Sit up straight, and do not tense your muscles.  Quit smoking if you smoke.  Limit alcohol use.  Decrease the amount of caffeine you drink, or stop drinking caffeine.  Eat and sleep on a regular schedule.  Get 7 to 9 hours of sleep, or as recommended by your caregiver.  Keep lights dim if bright lights bother you and make your headaches worse. SEEK MEDICAL CARE IF:   You have problems with the  medicines you were prescribed.  Your medicines are not working.  You have a change from the usual headache.  You have nausea or vomiting. SEEK IMMEDIATE MEDICAL CARE IF:   Your headache becomes severe.  You have a fever.  You have a stiff neck.  You have loss of vision.  You have muscular weakness or loss of muscle control.  You start losing your balance or have trouble walking.  You feel faint or pass out.  You have severe symptoms that are different from your first symptoms. MAKE SURE YOU:   Understand these instructions.  Will watch your condition.  Will get help right away if you are not doing well or get worse. Document Released: 05/18/2005 Document Revised: 08/10/2011 Document Reviewed: 06/03/2011 [facility] Patient Information 2015 [place], [place]. This information is not intended to replace advice given to you by your health care provider. Make sure you discuss any questions you have with your health care provider.  Headaches, Frequently Asked Questions MIGRAINE HEADACHES Q: What is migraine? What causes it? How can I treat it? A: Generally, migraine headaches begin as a dull ache. Then they develop into a constant, throbbing, and pulsating pain. You may experience pain at the temples. You may experience pain at the front or back of one or both sides of the head. The pain is usually accompanied by a combination of:  Nausea.  Vomiting.  Sensitivity to light and noise. Some people (about 15%)  experience an aura (see below) before an attack. The cause of migraine is believed to be chemical reactions in the brain. Treatment for migraine may include over-the-counter or prescription medications. It may also include self-help techniques. These include relaxation training and biofeedback.  Q: What is an aura? A: About 15% of people with migraine get an "aura". This is a sign of neurological symptoms that occur before a migraine headache. You may see wavy or jagged lines,  dots, or flashing lights. You might experience tunnel vision or blind spots in one or both eyes. The aura can include visual or auditory hallucinations (something imagined). It may include disruptions in smell (such as strange odors), taste or touch. Other symptoms include:  Numbness.  A "pins and needles" sensation.  Difficulty in recalling or speaking the correct word. These neurological events may last as long as 60 minutes. These symptoms will fade as the headache begins. Q: What is a trigger? A: Certain physical or environmental factors can lead to or "trigger" a migraine. These include:  Foods.  Hormonal changes.  Weather.  Stress. It is important to remember that triggers are different for everyone. To help prevent migraine attacks, you need to figure out which triggers affect you. Keep a headache diary. This is a good way to track triggers. The diary will help you talk to your healthcare professional about your condition. Q: Does weather affect migraines? A: Bright sunshine, hot, humid conditions, and drastic changes in barometric pressure may lead to, or "trigger," a migraine attack in some people. But studies have shown that weather does not act as a trigger for everyone with migraines. Q: What is the link between migraine and hormones? A: Hormones start and regulate many of your body's functions. Hormones keep your body in balance within a constantly changing environment. The levels of hormones in your body are unbalanced at times. Examples are during menstruation, pregnancy, or menopause. That can lead to a migraine attack. In fact, about three quarters of all women with migraine report that their attacks are related to the menstrual cycle.  Q: Is there an increased risk of stroke for migraine sufferers? A: The likelihood of a migraine attack causing a stroke is very remote. That is not to say that migraine sufferers cannot have a stroke associated with their migraines. In persons  under age 33, the most common associated factor for stroke is migraine headache. But over the course of a person's normal life span, the occurrence of migraine headache may actually be associated with a reduced risk of dying from cerebrovascular disease due to stroke.  Q: What are acute medications for migraine? A: Acute medications are used to treat the pain of the headache after it has started. Examples over-the-counter medications, NSAIDs, ergots, and triptans.  Q: What are the triptans? A: Triptans are the newest class of abortive medications. They are specifically targeted to treat migraine. Triptans are vasoconstrictors. They moderate some chemical reactions in the brain. The triptans work on receptors in your brain. Triptans help to restore the balance of a neurotransmitter called serotonin. Fluctuations in levels of serotonin are thought to be a main cause of migraine.  Q: Are over-the-counter medications for migraine effective? A: Over-the-counter, or "OTC," medications may be effective in relieving mild to moderate pain and associated symptoms of migraine. But you should see your caregiver before beginning any treatment regimen for migraine.  Q: What are preventive medications for migraine? A: Preventive medications for migraine are sometimes referred to as "prophylactic" treatments.  They are used to reduce the frequency, severity, and length of migraine attacks. Examples of preventive medications include antiepileptic medications, antidepressants, beta-blockers, calcium channel blockers, and NSAIDs (nonsteroidal anti-inflammatory drugs). Q: Why are anticonvulsants used to treat migraine? A: During the past few years, there has been an increased interest in antiepileptic drugs for the prevention of migraine. They are sometimes referred to as "anticonvulsants". Both epilepsy and migraine may be caused by similar reactions in the brain.  Q: Why are antidepressants used to treat migraine? A:  Antidepressants are typically used to treat people with depression. They may reduce migraine frequency by regulating chemical levels, such as serotonin, in the brain.  Q: What alternative therapies are used to treat migraine? A: The term "alternative therapies" is often used to describe treatments considered outside the scope of conventional Western medicine. Examples of alternative therapy include acupuncture, acupressure, and yoga. Another common alternative treatment is herbal therapy. Some herbs are believed to relieve headache pain. Always discuss alternative therapies with your caregiver before proceeding. Some herbal products contain arsenic and other toxins. TENSION HEADACHES Q: What is a tension-type headache? What causes it? How can I treat it? A: Tension-type headaches occur randomly. They are often the result of temporary stress, anxiety, fatigue, or anger. Symptoms include soreness in your temples, a tightening band-like sensation around your head (a "vice-like" ache). Symptoms can also include a pulling feeling, pressure sensations, and contracting head and neck muscles. The headache begins in your forehead, temples, or the back of your head and neck. Treatment for tension-type headache may include over-the-counter or prescription medications. Treatment may also include self-help techniques such as relaxation training and biofeedback. CLUSTER HEADACHES Q: What is a cluster headache? What causes it? How can I treat it? A: Cluster headache gets its name because the attacks come in groups. The pain arrives with little, if any, warning. It is usually on one side of the head. A tearing or bloodshot eye and a runny nose on the same side of the headache may also accompany the pain. Cluster headaches are believed to be caused by chemical reactions in the brain. They have been described as the most severe and intense of any headache type. Treatment for cluster headache includes prescription medication  and oxygen. SINUS HEADACHES Q: What is a sinus headache? What causes it? How can I treat it? A: When a cavity in the bones of the face and skull (a sinus) becomes inflamed, the inflammation will cause localized pain. This condition is usually the result of an allergic reaction, a tumor, or an infection. If your headache is caused by a sinus blockage, such as an infection, you will probably have a fever. An x-ray will confirm a sinus blockage. Your caregiver's treatment might include antibiotics for the infection, as well as antihistamines or decongestants.  REBOUND HEADACHES Q: What is a rebound headache? What causes it? How can I treat it? A: A pattern of taking acute headache medications too often can lead to a condition known as "rebound headache." A pattern of taking too much headache medication includes taking it more than 2 days per week or in excessive amounts. That means more than the label or a caregiver advises. With rebound headaches, your medications not only stop relieving pain, they actually begin to cause headaches. Doctors treat rebound headache by tapering the medication that is being overused. Sometimes your caregiver will gradually substitute a different type of treatment or medication. Stopping may be a challenge. Regularly overusing a medication increases the  potential for serious side effects. Consult a caregiver if you regularly use headache medications more than 2 days per week or more than the label advises. ADDITIONAL QUESTIONS AND ANSWERS Q: What is biofeedback? A: Biofeedback is a self-help treatment. Biofeedback uses special equipment to monitor your body's involuntary physical responses. Biofeedback monitors:  Breathing.  Pulse.  Heart rate.  Temperature.  Muscle tension.  Brain activity. Biofeedback helps you refine and perfect your relaxation exercises. You learn to control the physical responses that are related to stress. Once the technique has been mastered,  you do not need the equipment any more. Q: Are headaches hereditary? A: Four out of five (80%) of people that suffer report a family history of migraine. Scientists are not sure if this is genetic or a family predisposition. Despite the uncertainty, a child has a 50% chance of having migraine if one parent suffers. The child has a 75% chance if both parents suffer.  Q: Can children get headaches? A: By the time they reach high school, most young people have experienced some type of headache. Many safe and effective approaches or medications can prevent a headache from occurring or stop it after it has begun.  Q: What type of doctor should I see to diagnose and treat my headache? A: Start with your primary caregiver. Discuss his or her experience and approach to headaches. Discuss methods of classification, diagnosis, and treatment. Your caregiver may decide to recommend you to a headache specialist, depending upon your symptoms or other physical conditions. Having diabetes, allergies, etc., may require a more comprehensive and inclusive approach to your headache. The National Headache Foundation will provide, upon request, a list of [facility] physician members in your state. Document Released: 08/08/2003 Document Revised: 08/10/2011 Document Reviewed: 01/16/2008 [facility] Patient Information 2015 [place], [place]. This information is not intended to replace advice given to you by your health care provider. Make sure you discuss any questions you have with your health care provider.  Migraine Headache A migraine headache is an intense, throbbing pain on one or both sides of your head. A migraine can last for 30 minutes to several hours. CAUSES  The exact cause of a migraine headache is not always known. However, a migraine may be caused when nerves in the brain become irritated and release chemicals that cause inflammation. This causes pain. Certain things may also trigger migraines, such  as:  Alcohol.  Smoking.  Stress.  Menstruation.  Aged cheeses.  Foods or drinks that contain nitrates, glutamate, aspartame, or tyramine.  Lack of sleep.  Chocolate.  Caffeine.  Hunger.  Physical exertion.  Fatigue.  Medicines used to treat chest pain (nitroglycerine), birth control pills, estrogen, and some blood pressure medicines. SIGNS AND SYMPTOMS  Pain on one or both sides of your head.  Pulsating or throbbing pain.  Severe pain that prevents daily activities.  Pain that is aggravated by any physical activity.  Nausea, vomiting, or both.  Dizziness.  Pain with exposure to bright lights, loud noises, or activity.  General sensitivity to bright lights, loud noises, or smells. Before you get a migraine, you may get warning signs that a migraine is coming (aura). An aura may include:  Seeing flashing lights.  Seeing bright spots, halos, or zigzag lines.  Having tunnel vision or blurred vision.  Having feelings of numbness or tingling.  Having trouble talking.  Having muscle weakness. DIAGNOSIS  A migraine headache is often diagnosed based on:  Symptoms.  Physical exam.  A CT scan or  MRI of your head. These imaging tests cannot diagnose migraines, but they can help rule out other causes of headaches. TREATMENT Medicines may be given for pain and nausea. Medicines can also be given to help prevent recurrent migraines.  HOME CARE INSTRUCTIONS  Only take over-the-counter or prescription medicines for pain or discomfort as directed by your health care provider. The use of long-term narcotics is not recommended.  Lie down in a dark, quiet room when you have a migraine.  Keep a journal to find out what may trigger your migraine headaches. For example, write down:  What you eat and drink.  How much sleep you get.  Any change to your diet or medicines.  Limit alcohol consumption.  Quit smoking if you smoke.  Get 7-9 hours of sleep, or as  recommended by your health care provider.  Limit stress.  Keep lights dim if bright lights bother you and make your migraines worse. SEEK IMMEDIATE MEDICAL CARE IF:   Your migraine becomes severe.  You have a fever.  You have a stiff neck.  You have vision loss.  You have muscular weakness or loss of muscle control.  You start losing your balance or have trouble walking.  You feel faint or pass out.  You have severe symptoms that are different from your first symptoms. MAKE SURE YOU:   Understand these instructions.  Will watch your condition.  Will get help right away if you are not doing well or get worse. Document Released: 05/18/2005 Document Revised: 10/02/2013 Document Reviewed: 01/23/2013 [facility] Patient Information 2015 [place], [place]. This information is not intended to replace advice given to you by your health care provider. Make sure you discuss any questions you have with your health care provider.
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
## 2014-04-18 ENCOUNTER — Emergency Department (HOSPITAL_COMMUNITY)
Admission: EM | Admit: 2014-04-18 | Discharge: 2014-04-18 | Disposition: A | Discharge status: Home / Self Care | Payer: Self-pay | Attending: Emergency Medicine | Admitting: Emergency Medicine

## 2014-04-18 ENCOUNTER — Encounter (HOSPITAL_COMMUNITY): Payer: Self-pay | Admitting: Emergency Medicine

## 2014-04-18 DIAGNOSIS — G43009 Migraine without aura, not intractable, without status migrainosus: Secondary | ICD-10-CM | POA: Insufficient documentation

## 2014-04-18 DIAGNOSIS — Z720 Tobacco use: Secondary | ICD-10-CM | POA: Insufficient documentation

## 2014-04-18 DIAGNOSIS — Z87828 Personal history of other (healed) physical injury and trauma: Secondary | ICD-10-CM | POA: Insufficient documentation

## 2014-04-18 HISTORY — DX: Migraine, unspecified, not intractable, without status migrainosus: G43.909

## 2014-04-18 MED ORDER — SUMATRIPTAN SUCCINATE 6 MG/0.5ML ~~LOC~~ SOLN
6.0000 mg | Freq: Once | SUBCUTANEOUS | Status: AC
  Administered 2014-04-18: 6 mg via SUBCUTANEOUS
  Filled 2014-04-18: qty 0.5

## 2014-04-18 MED ORDER — SUMATRIPTAN SUCCINATE 50 MG PO TABS: 50.0000 mg | ORAL_TABLET | ORAL | Status: DC | PRN

## 2014-04-18 NOTE — ED Notes (Signed)
Pt from home with c/o migraine starting last night waking him from sleep.  Pt took ibuprofen as directed from a previous visit with no relief.  Pt reports the migraines started around 2 months ago and are only getting worse.  Pt in NAD, ambulatory, A&O.

## 2014-04-18 NOTE — Discharge Planning (Signed)
CARE MANAGEMENT ED NOTE 04/18/2014  Patient:  [name], [name]   Account Number:  [id number]  Date Initiated:  04/18/2014  Documentation initiated by:  [facility]  Subjective/Objective Assessment:   Chief Complaint    Patient presents with    .  Migraine   //Home alone      Subjective/Objective Assessment Detail:   Pt comes in with cc of headaches. Headaches x 3 weeks, constant, with waxing and waning intensity, left sided, with radiation to the neck. No nausea, vomiting, visual complains, seizures, altered mental status, loss of consciousness, new weakness, or numbness, no gait instability. Pt's headaches are worse at night time.     Action/Plan:   CT scan; medication   Action/Plan Detail:   CT scan showing no tumors or subdural.  There is no signs of infection, vitals are stable, neck exam is normal - no bruits, and with no trauma or other risk factors - dissection unlikely.  Could be migranous headache, or complex headache.   Anticipated DC Date:  04/18/2014     Status Recommendation to Physician:   Result of Recommendation:      DC Planning Services  CM consult  PCP issues    Choice offered to / List presented to:            Status of service:  Completed, signed off  ED Comments:  Pt does not have PCP and is self-pay.  NCM consult for PCP issues and medication assistance.  ED Comments Detail:  ED CM Consulted to meet with patient concerning f/u care with PCP and patient does not have insurance. Pt presented to [facility] ED today with head ache.  Met with patient at bedside, confirmed informaton. Pt  reports not having access to f/u care with PCP, or insurance coverage. Discussed with patient importance and benefits of establishing PCP, and not utilizing the ED for primary care needs. Pt verbalized understanding and is in agreement. Discussed other options, provided list of local  affordable PCPs.  Pt voiced  Interest in the [facility] and [facility].  [facility] Brochure  given with address, phone number, and the services highlighted. Explained that there is a Customer service manager on site who will assist with [employer] and process. Instructed to call or walk in Monday - Friday between the hours of 9- 5:30p to schedule appt for establishing care for PCP. Pt verbalized understanding.
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
## 2014-04-29 ENCOUNTER — Encounter (HOSPITAL_COMMUNITY): Payer: Self-pay | Admitting: *Deleted

## 2014-04-29 ENCOUNTER — Emergency Department (HOSPITAL_COMMUNITY)
Admission: EM | Admit: 2014-04-29 | Discharge: 2014-04-29 | Disposition: A | Discharge status: Home / Self Care | Payer: Self-pay | Attending: Emergency Medicine | Admitting: Emergency Medicine

## 2014-04-29 DIAGNOSIS — R51 Headache: Secondary | ICD-10-CM

## 2014-04-29 DIAGNOSIS — R519 Headache, unspecified: Secondary | ICD-10-CM

## 2014-04-29 DIAGNOSIS — Z8781 Personal history of (healed) traumatic fracture: Secondary | ICD-10-CM | POA: Insufficient documentation

## 2014-04-29 DIAGNOSIS — Z79899 Other long term (current) drug therapy: Secondary | ICD-10-CM | POA: Insufficient documentation

## 2014-04-29 DIAGNOSIS — Z720 Tobacco use: Secondary | ICD-10-CM | POA: Insufficient documentation

## 2014-04-29 DIAGNOSIS — G43909 Migraine, unspecified, not intractable, without status migrainosus: Secondary | ICD-10-CM | POA: Insufficient documentation

## 2014-04-29 MED ORDER — DIPHENHYDRAMINE HCL 50 MG/ML IJ SOLN
50.0000 mg | Freq: Once | INTRAMUSCULAR | Status: AC
  Administered 2014-04-29: 50 mg via INTRAVENOUS
  Filled 2014-04-29: qty 1

## 2014-04-29 MED ORDER — DEXAMETHASONE SODIUM PHOSPHATE 10 MG/ML IJ SOLN
10.0000 mg | Freq: Once | INTRAMUSCULAR | Status: AC
  Administered 2014-04-29: 10 mg via INTRAVENOUS
  Filled 2014-04-29: qty 1

## 2014-04-29 MED ORDER — METOCLOPRAMIDE HCL 5 MG/ML IJ SOLN
10.0000 mg | Freq: Once | INTRAMUSCULAR | Status: AC
  Administered 2014-04-29: 10 mg via INTRAVENOUS
  Filled 2014-04-29: qty 2

## 2014-04-29 MED ORDER — SODIUM CHLORIDE 0.9 % IV BOLUS (SEPSIS)
500.0000 mL | Freq: Once | INTRAVENOUS | Status: AC
  Administered 2014-04-29: 500 mL via INTRAVENOUS
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
## 2014-04-29 NOTE — ED Notes (Signed)
Pt is here with migraine for 2 months and has been referred to neurologist.

## 2014-04-29 NOTE — Discharge Instructions (Signed)
You were evaluated in the ED today for your headache. This appears to be a chronic problem for you. You need to follow-up with your neurologist for your regularly scheduled appointment on Wednesday. Please return to ED for further evaluation if your symptoms worsen, you experience numbness or weakness, fevers

## 2014-05-20 ENCOUNTER — Emergency Department (HOSPITAL_COMMUNITY)
Admission: EM | Admit: 2014-05-20 | Discharge: 2014-05-20 | Disposition: A | Discharge status: Home / Self Care | Payer: Self-pay | Attending: Emergency Medicine | Admitting: Emergency Medicine

## 2014-05-20 ENCOUNTER — Encounter (HOSPITAL_COMMUNITY): Payer: Self-pay | Admitting: Nurse Practitioner

## 2014-05-20 DIAGNOSIS — Y288XXA Contact with other sharp object, undetermined intent, initial encounter: Secondary | ICD-10-CM | POA: Insufficient documentation

## 2014-05-20 DIAGNOSIS — Y9389 Activity, other specified: Secondary | ICD-10-CM | POA: Insufficient documentation

## 2014-05-20 DIAGNOSIS — S61411A Laceration without foreign body of right hand, initial encounter: Secondary | ICD-10-CM | POA: Insufficient documentation

## 2014-05-20 DIAGNOSIS — Y998 Other external cause status: Secondary | ICD-10-CM | POA: Insufficient documentation

## 2014-05-20 DIAGNOSIS — IMO0002 Reserved for concepts with insufficient information to code with codable children: Secondary | ICD-10-CM

## 2014-05-20 DIAGNOSIS — Z720 Tobacco use: Secondary | ICD-10-CM | POA: Insufficient documentation

## 2014-05-20 DIAGNOSIS — S61412A Laceration without foreign body of left hand, initial encounter: Secondary | ICD-10-CM | POA: Insufficient documentation

## 2014-05-20 DIAGNOSIS — Y929 Unspecified place or not applicable: Secondary | ICD-10-CM | POA: Insufficient documentation

## 2014-05-20 DIAGNOSIS — Z23 Encounter for immunization: Secondary | ICD-10-CM | POA: Insufficient documentation

## 2014-05-20 MED ORDER — OXYCODONE-ACETAMINOPHEN 5-325 MG PO TABS
1.0000 | ORAL_TABLET | Freq: Once | ORAL | Status: AC
  Administered 2014-05-20: 1 via ORAL
  Filled 2014-05-20: qty 1

## 2014-05-20 MED ORDER — TETANUS-DIPHTH-ACELL PERTUSSIS 5-2.5-18.5 LF-MCG/0.5 IM SUSP
0.5000 mL | Freq: Once | INTRAMUSCULAR | Status: AC
Start: 2014-05-20 — End: 2014-05-20
  Administered 2014-05-20: 0.5 mL via INTRAMUSCULAR
  Filled 2014-05-20: qty 0.5

## 2014-05-20 MED ORDER — NAPROXEN 500 MG PO TABS: 500.0000 mg | ORAL_TABLET | Freq: Two times a day (BID) | ORAL | Status: DC

## 2014-05-20 MED ORDER — CEPHALEXIN 500 MG PO CAPS: 500.0000 mg | ORAL_CAPSULE | Freq: Four times a day (QID) | ORAL | Status: DC
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
## 2014-05-20 NOTE — Discharge Instructions (Signed)
Follow directions provided. It is important to follow-up this week with the hand doctor to ensure your wounds are healing well. Please take the antibiotics as directed. You may take the naproxen twice a day for pain. Keep your wound clean and dry and keep dressing intact until they're well-healed. Don't hesitate to return for any new, worsening, or concerning symptoms.  SEEK MEDICAL CARE IF:  You have redness, swelling, or increasing pain in the laceration.  You notice a red line that goes away from your laceration.  You have pus coming from the laceration.  You have a fever.  You notice a bad smell coming from the laceration or dressing.  You notice something coming out of the laceration, such as wood or glass.  Your laceration is on your hand or foot and you are unable to properly move a finger or toe.  You have severe swelling around the laceration, causing pain and numbness.  You notice a change in color in your arm, hand, leg, or foot.
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
## 2014-05-20 NOTE — ED Notes (Signed)
Declined W/C at D/C and was escorted to lobby by RN. 

## 2015-04-21 ENCOUNTER — Encounter (HOSPITAL_COMMUNITY): Payer: Self-pay

## 2015-04-21 ENCOUNTER — Emergency Department (HOSPITAL_COMMUNITY)
Admission: EM | Admit: 2015-04-21 | Discharge: 2015-04-21 | Disposition: A | Discharge status: Home / Self Care | Payer: Self-pay | Attending: Emergency Medicine | Admitting: Emergency Medicine

## 2015-04-21 DIAGNOSIS — G43909 Migraine, unspecified, not intractable, without status migrainosus: Secondary | ICD-10-CM | POA: Insufficient documentation

## 2015-04-21 DIAGNOSIS — Z791 Long term (current) use of non-steroidal anti-inflammatories (NSAID): Secondary | ICD-10-CM | POA: Insufficient documentation

## 2015-04-21 DIAGNOSIS — K0889 Other specified disorders of teeth and supporting structures: Secondary | ICD-10-CM | POA: Insufficient documentation

## 2015-04-21 DIAGNOSIS — Z87828 Personal history of other (healed) physical injury and trauma: Secondary | ICD-10-CM | POA: Insufficient documentation

## 2015-04-21 DIAGNOSIS — Z792 Long term (current) use of antibiotics: Secondary | ICD-10-CM | POA: Insufficient documentation

## 2015-04-21 DIAGNOSIS — F17210 Nicotine dependence, cigarettes, uncomplicated: Secondary | ICD-10-CM | POA: Insufficient documentation

## 2015-04-21 MED ORDER — AMOXICILLIN 500 MG PO CAPS: 500.0000 mg | ORAL_CAPSULE | Freq: Three times a day (TID) | ORAL | Status: DC

## 2015-04-21 MED ORDER — TRAMADOL HCL 50 MG PO TABS: 50.0000 mg | ORAL_TABLET | Freq: Four times a day (QID) | ORAL | Status: DC | PRN

## 2015-04-21 MED ORDER — AMOXICILLIN 500 MG PO CAPS
500.0000 mg | ORAL_CAPSULE | Freq: Once | ORAL | Status: AC
  Administered 2015-04-21: 500 mg via ORAL
  Filled 2015-04-21: qty 1

## 2015-04-21 NOTE — ED Notes (Signed)
Pt reports he has had toothache X2 days. Reports he had abd pain yesterday but that has since cleared up. Pt reports he has no PCP or dentist.
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
## 2015-04-21 NOTE — ED Notes (Signed)
Declined W/C at D/C and was escorted to lobby by RN. 

## 2015-04-21 NOTE — Discharge Instructions (Signed)
For pain control you may take:  800mg of ibuprofen (that is usually 4 over the counter pills)  3 times a day (take with food) and acetaminophen 975mg (this is 3 over the counter pills) four times a day. Do not drink alcohol or combine with other medications that have acetaminophen as an ingredient (Read the labels!).  For breakthrough pain you may take Tramadol. Do not drink alcohol drive or operate heavy machinery when taking Tramadol. ° °Return to the emergency room for fever, change in vision, redness to the face that rapidly spreads towards the eye, nausea or vomiting, difficulty swallowing or shortness of breath. °  °Apply warm compresses to jaw throughout the day.  ° ° Take your antibiotics as directed and to the end of the course.  ° °Followup with a dentist is very important for ongoing evaluation and management of recurrent dental pain. Return to emergency department for emergent changing or worsening symptoms." ° °Low-cost dental clinic: °**[facility]  [facility]  at [phone]**  ° °You may also call [phone] ° °Dental Assistance °If the dentist on-call cannot see you, please use the resources below: ° ° °Patients with Medicaid: [facility] °[address], [phone] °[address], [phone] ° °If unable to pay, or uninsured, contact [facility] ([phone]) or [facility] ([phone] in [place], [phone] in [place]) to become qualified for the adult dental clinic ° °Other Low-Cost Community Dental Services: °[facility]- [address], [place], [place], [address] °   [phone], Ext. [phone] °   2nd and 4th Thursday of the month at 6:30am °   10 clients each day by appointment, can sometimes see walk-in     patients if someone does not show for an appointment °[facility]- [address], [place], [place], [address] °   [phone] °[facility]- [address], [place], , [address] °   [phone] ° °[facility]- [phone] °[facility]- [phone] °[facility]- [phone] ° °

## 2015-09-08 ENCOUNTER — Emergency Department (HOSPITAL_COMMUNITY)
Admission: EM | Admit: 2015-09-08 | Discharge: 2015-09-08 | Disposition: A | Discharge status: Home / Self Care | Payer: Self-pay | Attending: Emergency Medicine | Admitting: Emergency Medicine

## 2015-09-08 ENCOUNTER — Encounter (HOSPITAL_COMMUNITY): Payer: Self-pay | Admitting: *Deleted

## 2015-09-08 ENCOUNTER — Encounter (HOSPITAL_COMMUNITY): Payer: Self-pay | Admitting: Emergency Medicine

## 2015-09-08 DIAGNOSIS — F17210 Nicotine dependence, cigarettes, uncomplicated: Secondary | ICD-10-CM | POA: Insufficient documentation

## 2015-09-08 DIAGNOSIS — K047 Periapical abscess without sinus: Secondary | ICD-10-CM

## 2015-09-08 DIAGNOSIS — J029 Acute pharyngitis, unspecified: Secondary | ICD-10-CM | POA: Insufficient documentation

## 2015-09-08 DIAGNOSIS — K0889 Other specified disorders of teeth and supporting structures: Secondary | ICD-10-CM | POA: Insufficient documentation

## 2015-09-08 DIAGNOSIS — F17200 Nicotine dependence, unspecified, uncomplicated: Secondary | ICD-10-CM | POA: Insufficient documentation

## 2015-09-08 DIAGNOSIS — K029 Dental caries, unspecified: Secondary | ICD-10-CM | POA: Insufficient documentation

## 2015-09-08 DIAGNOSIS — Z8679 Personal history of other diseases of the circulatory system: Secondary | ICD-10-CM | POA: Insufficient documentation

## 2015-09-08 DIAGNOSIS — Z792 Long term (current) use of antibiotics: Secondary | ICD-10-CM | POA: Insufficient documentation

## 2015-09-08 DIAGNOSIS — Z87828 Personal history of other (healed) physical injury and trauma: Secondary | ICD-10-CM | POA: Insufficient documentation

## 2015-09-08 MED ORDER — OXYCODONE-ACETAMINOPHEN 5-325 MG PO TABS
2.0000 | ORAL_TABLET | Freq: Once | ORAL | Status: AC
  Administered 2015-09-08: 2 via ORAL
  Filled 2015-09-08: qty 2

## 2015-09-08 MED ORDER — AMOXICILLIN 500 MG PO CAPS: 500.0000 mg | ORAL_CAPSULE | Freq: Three times a day (TID) | ORAL | Status: DC

## 2015-09-08 MED ORDER — OXYCODONE-ACETAMINOPHEN 5-325 MG PO TABS: 1.0000 | ORAL_TABLET | ORAL | Status: DC | PRN

## 2015-09-08 MED ORDER — ONDANSETRON 4 MG PO TBDP
4.0000 mg | ORAL_TABLET | Freq: Once | ORAL | Status: AC
  Administered 2015-09-08: 4 mg via ORAL
  Filled 2015-09-08: qty 1

## 2015-09-08 NOTE — Discharge Instructions (Signed)
You have been seen by your caregiver because of dental pain.  SEEK MEDICAL ATTENTION IF: The exam and treatment you received today has been provided on an emergency basis only. This is not a substitute for complete medical or dental care. If your problem worsens or new symptoms (problems) appear, and you are unable to arrange prompt follow-up care with your dentist, call or return to this location. CALL YOUR DENTIST OR RETURN IMMEDIATELY IF you develop a fever, rash, difficulty breathing or swallowing, neck or facial swelling, or other potentially serious concerns.   Dental Abscess A dental abscess is a collection of pus in or around a tooth. CAUSES This condition is caused by a bacterial infection around the root of the tooth that involves the inner part of the tooth (pulp). It may result from:  Severe tooth decay.  Trauma to the tooth that allows bacteria to enter into the pulp, such as a broken or chipped tooth.  Severe gum disease around a tooth. SYMPTOMS Symptoms of this condition include:  Severe pain in and around the infected tooth.  Swelling and redness around the infected tooth, in the mouth, or in the face.  Tenderness.  Pus drainage.  Bad breath.  Bitter taste in the mouth.  Difficulty swallowing.  Difficulty opening the mouth.  Nausea.  Vomiting.  Chills.  Swollen neck glands.  Fever. DIAGNOSIS This condition is diagnosed with examination of the infected tooth. During the exam, your dentist may tap on the infected tooth. Your dentist will also ask about your medical and dental history and may order X-rays. TREATMENT This condition is treated by eliminating the infection. This may be done with:  Antibiotic medicine.  A root canal. This may be performed to save the tooth.  Pulling (extracting) the tooth. This may also involve draining the abscess. This is done if the tooth cannot be saved. HOME CARE INSTRUCTIONS  Take medicines only as directed by  your dentist.  If you were prescribed antibiotic medicine, finish all of it even if you start to feel better.  Rinse your mouth (gargle) often with salt water to relieve pain or swelling.  Do not drive or operate heavy machinery while taking pain medicine.  Do not apply heat to the outside of your mouth.  Keep all follow-up visits as directed by your dentist. This is important. SEEK MEDICAL CARE IF:  Your pain is worse and is not helped by medicine. SEEK IMMEDIATE MEDICAL CARE IF:  You have a fever or chills.  Your symptoms suddenly get worse.  You have a very bad headache.  You have problems breathing or swallowing.  You have trouble opening your mouth.  You have swelling in your neck or around your eye.   This information is not intended to replace advice given to you by your health care provider. Make sure you discuss any questions you have with your health care provider.   Document Released: 05/18/2005 Document Revised: 10/02/2014 Document Reviewed: 05/15/2014 Elsevier Interactive Patient Education 2016 Elsevier Inc.       [facility] [employer]  [employer] [facility]  [facility]  [address]  [place], [place] [address]  Phone [phone]  [facility] [employer] [facility] [place] exemplifies the [employer] vision to improve the health and quality of life of all [place] by Public house managercreating leaders with a passion to care for the underserved and by leading the nation in community-based,  service learning oral health education. We are committed to offering comprehensive general dental services for adults, children and special needs patients in a safe, caring and professional setting.  Appointments: Our clinic is open Monday through Friday 8:00 a.m. until 5:00 p.m. The amount of time scheduled for an appointment depends on the patients  specific needs. We ask that you keep your appointed time for care or provide 24-hour notice of all appointment changes. Parents or legal guardians must accompany minor children.  Payment for Services: Medicaid and other insurance plans are welcome. Payment for services is due when services are rendered and may be made by cash or credit card. If you have dental insurance, we will assist you with your claim submission.   Emergencies: Emergency services will be provided Monday through Friday on a walk-in basis. Please arrive early for emergency services. After hours emergency services will be provided for patients of record as required.  Services:  Medical illustrator Dentistry  Oral Surgery - Extractions  Root Canals  Sealants and Tooth Colored Fillings  Crowns and Bridges  Dentures and Partial Dentures  Implant Services  Periodontal Services and Cleanings  Cosmetic Building services engineer  3-D/[facility]

## 2015-09-08 NOTE — ED Notes (Signed)
The pt is c/o pain in his mouth and teeth he thinks he was bitten by something in the middle of the night.  He has pain in his throat and all mover his mouth
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
## 2015-09-08 NOTE — ED Notes (Signed)
Patient called 4 times to be taken to a room without answer

## 2015-09-08 NOTE — ED Notes (Signed)
Pt here for sore throat and dental pain x 2 days

## 2015-09-21 ENCOUNTER — Emergency Department (HOSPITAL_COMMUNITY)
Admission: EM | Admit: 2015-09-21 | Discharge: 2015-09-21 | Disposition: A | Discharge status: Home / Self Care | Payer: Self-pay | Attending: Emergency Medicine | Admitting: Emergency Medicine

## 2015-09-21 ENCOUNTER — Encounter (HOSPITAL_COMMUNITY): Payer: Self-pay | Admitting: *Deleted

## 2015-09-21 DIAGNOSIS — G43909 Migraine, unspecified, not intractable, without status migrainosus: Secondary | ICD-10-CM | POA: Insufficient documentation

## 2015-09-21 DIAGNOSIS — Z87828 Personal history of other (healed) physical injury and trauma: Secondary | ICD-10-CM | POA: Insufficient documentation

## 2015-09-21 DIAGNOSIS — F17210 Nicotine dependence, cigarettes, uncomplicated: Secondary | ICD-10-CM | POA: Insufficient documentation

## 2015-09-21 DIAGNOSIS — K029 Dental caries, unspecified: Secondary | ICD-10-CM | POA: Insufficient documentation

## 2015-09-21 DIAGNOSIS — K047 Periapical abscess without sinus: Secondary | ICD-10-CM | POA: Insufficient documentation

## 2015-09-21 DIAGNOSIS — Z791 Long term (current) use of non-steroidal anti-inflammatories (NSAID): Secondary | ICD-10-CM | POA: Insufficient documentation

## 2015-09-21 MED ORDER — CLINDAMYCIN HCL 150 MG PO CAPS: 300.0000 mg | ORAL_CAPSULE | Freq: Four times a day (QID) | ORAL | Status: DC

## 2015-09-21 MED ORDER — OXYCODONE-ACETAMINOPHEN 5-325 MG PO TABS
1.0000 | ORAL_TABLET | Freq: Once | ORAL | Status: AC
  Administered 2015-09-21: 1 via ORAL
  Filled 2015-09-21: qty 1

## 2015-09-21 MED ORDER — TRAMADOL HCL 50 MG PO TABS: 50.0000 mg | ORAL_TABLET | Freq: Four times a day (QID) | ORAL | Status: DC | PRN

## 2015-09-21 NOTE — Discharge Instructions (Signed)
[facility].

## 2015-09-21 NOTE — ED Notes (Signed)
Pt reports left side upper dental pain and pus pocket. Hx of same and had tooth extracted. Airway intact.

## 2015-09-21 NOTE — ED Notes (Signed)
Hope NP at bedside  

## 2015-09-21 NOTE — ED Notes (Signed)
Patient verbalized understanding of discharge instructions and denies any further needs or questions at this time. VS stable. Patient ambulatory with steady gait.  
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
## 2016-10-31 ENCOUNTER — Encounter (HOSPITAL_COMMUNITY): Payer: Self-pay | Admitting: Emergency Medicine

## 2016-10-31 ENCOUNTER — Emergency Department (HOSPITAL_COMMUNITY)
Admission: EM | Admit: 2016-10-31 | Discharge: 2016-10-31 | Disposition: A | Discharge status: Home / Self Care | Payer: Self-pay | Attending: Emergency Medicine | Admitting: Emergency Medicine

## 2016-10-31 DIAGNOSIS — W57XXXA Bitten or stung by nonvenomous insect and other nonvenomous arthropods, initial encounter: Secondary | ICD-10-CM | POA: Insufficient documentation

## 2016-10-31 DIAGNOSIS — F17210 Nicotine dependence, cigarettes, uncomplicated: Secondary | ICD-10-CM | POA: Insufficient documentation

## 2016-10-31 DIAGNOSIS — L0291 Cutaneous abscess, unspecified: Secondary | ICD-10-CM

## 2016-10-31 DIAGNOSIS — Y939 Activity, unspecified: Secondary | ICD-10-CM | POA: Insufficient documentation

## 2016-10-31 DIAGNOSIS — Y929 Unspecified place or not applicable: Secondary | ICD-10-CM | POA: Insufficient documentation

## 2016-10-31 DIAGNOSIS — L03115 Cellulitis of right lower limb: Secondary | ICD-10-CM

## 2016-10-31 DIAGNOSIS — Y999 Unspecified external cause status: Secondary | ICD-10-CM | POA: Insufficient documentation

## 2016-10-31 DIAGNOSIS — L02416 Cutaneous abscess of left lower limb: Secondary | ICD-10-CM | POA: Insufficient documentation

## 2016-10-31 MED ORDER — CEPHALEXIN 500 MG PO CAPS: 500.0000 mg | ORAL_CAPSULE | Freq: Three times a day (TID) | ORAL | 0 refills | Status: DC

## 2016-10-31 MED ORDER — IBUPROFEN 400 MG PO TABS
600.0000 mg | ORAL_TABLET | Freq: Once | ORAL | Status: AC
  Administered 2016-10-31: 600 mg via ORAL
  Filled 2016-10-31: qty 1

## 2016-10-31 MED ORDER — LIDOCAINE-EPINEPHRINE (PF) 2 %-1:200000 IJ SOLN
10.0000 mL | Freq: Once | INTRAMUSCULAR | Status: AC
  Administered 2016-10-31: 10 mL via INTRADERMAL
  Filled 2016-10-31: qty 20

## 2016-10-31 NOTE — ED Notes (Signed)
Declined W/C at D/C and was escorted to lobby by RN. 

## 2016-10-31 NOTE — Discharge Instructions (Signed)
Apply warm compresses to the area to continue help express drainage.  You can take Tylenol or Ibuprofen as directed for pain.  Take antibiotics as directed. Please take all of your antibiotics until finished.  Follow-up with primary care doctor in the next 24-48 hours.  Primary care doctor U cane he is one listed below.  Return the emergency Department for any worsening pain, swelling/redness that spreads to the leg, fever, drainage or any other worsening or concerning symptoms.
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
## 2016-10-31 NOTE — ED Triage Notes (Addendum)
Pt reports possible spider bite. Pt has 2mm raised white head to front of left shin. Pt did not feel a bite but first noticed left shin pain. Head formed later in the day. Denies injury to the leg. Redness noted around the raised white head.

## 2016-11-20 ENCOUNTER — Emergency Department (HOSPITAL_COMMUNITY)
Admission: EM | Admit: 2016-11-20 | Discharge: 2016-11-21 | Discharge status: Against Medical Advice | Payer: Self-pay | Attending: Emergency Medicine | Admitting: Emergency Medicine

## 2016-11-20 ENCOUNTER — Encounter (HOSPITAL_COMMUNITY): Payer: Self-pay

## 2016-11-20 DIAGNOSIS — K0889 Other specified disorders of teeth and supporting structures: Secondary | ICD-10-CM | POA: Insufficient documentation

## 2016-11-20 DIAGNOSIS — Z5321 Procedure and treatment not carried out due to patient leaving prior to being seen by health care provider: Secondary | ICD-10-CM | POA: Insufficient documentation

## 2016-11-20 DIAGNOSIS — F17210 Nicotine dependence, cigarettes, uncomplicated: Secondary | ICD-10-CM | POA: Insufficient documentation

## 2016-11-20 NOTE — ED Triage Notes (Signed)
Pt complaining of bilateral upper dental pain. Pt states ongoing x 2 weeks.

## 2016-12-10 ENCOUNTER — Emergency Department (HOSPITAL_COMMUNITY)
Admission: EM | Admit: 2016-12-10 | Discharge: 2016-12-11 | Disposition: A | Discharge status: Home / Self Care | Payer: Self-pay | Attending: Emergency Medicine | Admitting: Emergency Medicine

## 2016-12-10 DIAGNOSIS — R51 Headache: Secondary | ICD-10-CM | POA: Insufficient documentation

## 2016-12-10 DIAGNOSIS — Z5321 Procedure and treatment not carried out due to patient leaving prior to being seen by health care provider: Secondary | ICD-10-CM | POA: Insufficient documentation

## 2016-12-10 MED ORDER — OXYCODONE-ACETAMINOPHEN 5-325 MG PO TABS
ORAL_TABLET | ORAL | Status: AC
  Filled 2016-12-10: qty 1

## 2016-12-10 NOTE — ED Notes (Signed)
Pt requesting to leave, informed him I have assigned him a room so it shouldn't be much longer. Pt decided to stay.

## 2016-12-10 NOTE — ED Notes (Signed)
Called pt for room, now cant find him

## 2016-12-10 NOTE — ED Triage Notes (Signed)
Pt reports migraine X3 weeks, also has 3 teeth that need to be pulled.

## 2017-01-17 ENCOUNTER — Emergency Department (HOSPITAL_COMMUNITY)
Admission: EM | Admit: 2017-01-17 | Discharge: 2017-01-17 | Disposition: A | Discharge status: Home / Self Care | Payer: Self-pay | Attending: Emergency Medicine | Admitting: Emergency Medicine

## 2017-01-17 ENCOUNTER — Encounter (HOSPITAL_COMMUNITY): Payer: Self-pay | Admitting: Emergency Medicine

## 2017-01-17 DIAGNOSIS — K0889 Other specified disorders of teeth and supporting structures: Secondary | ICD-10-CM | POA: Insufficient documentation

## 2017-01-17 DIAGNOSIS — Z79899 Other long term (current) drug therapy: Secondary | ICD-10-CM | POA: Insufficient documentation

## 2017-01-17 DIAGNOSIS — F17210 Nicotine dependence, cigarettes, uncomplicated: Secondary | ICD-10-CM | POA: Insufficient documentation

## 2017-01-17 LAB — RAPID STREP SCREEN (MED CTR MEBANE ONLY): Streptococcus, Group A Screen (Direct): NEGATIVE

## 2017-01-17 MED ORDER — HYDROCODONE-IBUPROFEN 7.5-200 MG PO TABS: ORAL_TABLET | ORAL | 0 refills | Status: DC

## 2017-01-17 MED ORDER — IBUPROFEN 200 MG PO TABS
400.0000 mg | ORAL_TABLET | Freq: Once | ORAL | Status: AC | PRN
  Administered 2017-01-17: 400 mg via ORAL
  Filled 2017-01-17: qty 2
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
## 2017-01-17 NOTE — Discharge Instructions (Signed)
Return if any problems.  See the Oral surgeon tomorrow for evaluation.

## 2017-01-17 NOTE — ED Triage Notes (Signed)
Pt comes in after having his top wisdom teeth removed 8 days ago.  Now reports his bottom gums where his wisdom teeth are at are swollen and hurting.  Also reports a sore throat.  Pt has been taking an antibiotic.  Has follow up appointment this week with dental surgeon. Pt's throat red and swollen upon assessment. Pt airway in tact at this time.

## 2017-01-20 LAB — CULTURE, GROUP A STREP (THRC)

## 2017-12-28 ENCOUNTER — Emergency Department (HOSPITAL_COMMUNITY): Payer: Self-pay

## 2017-12-28 ENCOUNTER — Encounter (HOSPITAL_COMMUNITY): Payer: Self-pay | Admitting: *Deleted

## 2017-12-28 ENCOUNTER — Other Ambulatory Visit: Payer: Self-pay

## 2017-12-28 ENCOUNTER — Emergency Department (HOSPITAL_COMMUNITY)
Admission: EM | Admit: 2017-12-28 | Discharge: 2017-12-28 | Disposition: A | Discharge status: Home / Self Care | Payer: Self-pay | Attending: Emergency Medicine | Admitting: Emergency Medicine

## 2017-12-28 DIAGNOSIS — R51 Headache: Secondary | ICD-10-CM | POA: Insufficient documentation

## 2017-12-28 DIAGNOSIS — Z79899 Other long term (current) drug therapy: Secondary | ICD-10-CM | POA: Insufficient documentation

## 2017-12-28 DIAGNOSIS — F17210 Nicotine dependence, cigarettes, uncomplicated: Secondary | ICD-10-CM | POA: Insufficient documentation

## 2017-12-28 DIAGNOSIS — R519 Headache, unspecified: Secondary | ICD-10-CM

## 2017-12-28 LAB — COMPREHENSIVE METABOLIC PANEL
ALT: 14 U/L (ref 0–44)
AST: 28 U/L (ref 15–41)
Albumin: 3.9 g/dL (ref 3.5–5.0)
Alkaline Phosphatase: 62 U/L (ref 38–126)
Anion gap: 6 (ref 5–15)
BILIRUBIN TOTAL: 0.6 mg/dL (ref 0.3–1.2)
BUN: 15 mg/dL (ref 6–20)
CO2: 27 mmol/L (ref 22–32)
CREATININE: 1.07 mg/dL (ref 0.61–1.24)
Calcium: 9 mg/dL (ref 8.9–10.3)
Chloride: 107 mmol/L (ref 98–111)
Glucose, Bld: 93 mg/dL (ref 70–99)
Potassium: 4.1 mmol/L (ref 3.5–5.1)
Sodium: 140 mmol/L (ref 135–145)
Total Protein: 7 g/dL (ref 6.5–8.1)

## 2017-12-28 LAB — CBC WITH DIFFERENTIAL/PLATELET
Abs Immature Granulocytes: 0.1 10*3/uL (ref 0.0–0.1)
Basophils Absolute: 0.1 10*3/uL (ref 0.0–0.1)
Basophils Relative: 1 %
EOS PCT: 7 %
Eosinophils Absolute: 0.7 10*3/uL (ref 0.0–0.7)
HEMATOCRIT: 37.4 % — AB (ref 39.0–52.0)
Hemoglobin: 12 g/dL — ABNORMAL LOW (ref 13.0–17.0)
IMMATURE GRANULOCYTES: 1 %
Lymphocytes Relative: 21 %
Lymphs Abs: 2.1 10*3/uL (ref 0.7–4.0)
MCH: 28.5 pg (ref 26.0–34.0)
MCHC: 32.1 g/dL (ref 30.0–36.0)
MCV: 88.8 fL (ref 78.0–100.0)
MONOS PCT: 10 %
Monocytes Absolute: 1 10*3/uL (ref 0.1–1.0)
NEUTROS PCT: 60 %
Neutro Abs: 6.1 10*3/uL (ref 1.7–7.7)
Platelets: 211 10*3/uL (ref 150–400)
RBC: 4.21 MIL/uL — ABNORMAL LOW (ref 4.22–5.81)
RDW: 12.7 % (ref 11.5–15.5)
WBC: 10 10*3/uL (ref 4.0–10.5)

## 2017-12-28 LAB — SEDIMENTATION RATE: SED RATE: 5 mm/h (ref 0–16)

## 2017-12-28 MED ORDER — KETOROLAC TROMETHAMINE 30 MG/ML IJ SOLN
30.0000 mg | Freq: Once | INTRAMUSCULAR | Status: AC
Start: 2017-12-28 — End: 2017-12-28
  Administered 2017-12-28: 30 mg via INTRAVENOUS
  Filled 2017-12-28: qty 1

## 2017-12-28 MED ORDER — SODIUM CHLORIDE 0.9 % IV BOLUS (SEPSIS)
1000.0000 mL | Freq: Once | INTRAVENOUS | Status: AC
  Administered 2017-12-28: 1000 mL via INTRAVENOUS

## 2017-12-28 MED ORDER — METOCLOPRAMIDE HCL 5 MG/ML IJ SOLN
10.0000 mg | Freq: Once | INTRAMUSCULAR | Status: AC
Start: 2017-12-28 — End: 2017-12-28
  Administered 2017-12-28: 10 mg via INTRAVENOUS
  Filled 2017-12-28: qty 2

## 2017-12-28 MED ORDER — SODIUM CHLORIDE 0.9 % IV SOLN: 1000.0000 mL | INTRAVENOUS | Status: DC

## 2017-12-28 MED ORDER — DIPHENHYDRAMINE HCL 50 MG/ML IJ SOLN
25.0000 mg | Freq: Once | INTRAMUSCULAR | Status: AC
  Administered 2017-12-28: 25 mg via INTRAVENOUS
  Filled 2017-12-28: qty 1

## 2017-12-28 NOTE — ED Notes (Signed)
Pt called nurses station states day care called and he has to go get his son.

## 2017-12-28 NOTE — ED Notes (Signed)
AVS printed for pt to gain access to my chart
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
## 2017-12-28 NOTE — ED Notes (Signed)
Pt stable, ambulatory, states understanding of discharge instructions 

## 2017-12-28 NOTE — ED Notes (Signed)
Patient transported to CT 

## 2017-12-28 NOTE — ED Triage Notes (Signed)
Pt in c/o HA x 2 wks with relief with x 6 Ibuprofen tabs, pt now c/o abd pain, pt reports bites on torso after fishing that itches, pt denies n/v/d, pt denies n/v/d, A&O x4

## 2018-01-16 ENCOUNTER — Encounter (HOSPITAL_COMMUNITY): Payer: Self-pay | Admitting: Emergency Medicine

## 2018-01-16 ENCOUNTER — Emergency Department (HOSPITAL_COMMUNITY)
Admission: EM | Admit: 2018-01-16 | Discharge: 2018-01-16 | Disposition: A | Discharge status: Home / Self Care | Payer: Self-pay | Attending: Emergency Medicine | Admitting: Emergency Medicine

## 2018-01-16 DIAGNOSIS — G44019 Episodic cluster headache, not intractable: Secondary | ICD-10-CM | POA: Insufficient documentation

## 2018-01-16 DIAGNOSIS — F17210 Nicotine dependence, cigarettes, uncomplicated: Secondary | ICD-10-CM | POA: Insufficient documentation

## 2018-01-16 DIAGNOSIS — Z79899 Other long term (current) drug therapy: Secondary | ICD-10-CM | POA: Insufficient documentation

## 2018-01-16 MED ORDER — DIPHENHYDRAMINE HCL 50 MG/ML IJ SOLN
25.0000 mg | Freq: Once | INTRAMUSCULAR | Status: AC
  Administered 2018-01-16: 25 mg via INTRAMUSCULAR
  Filled 2018-01-16: qty 1

## 2018-01-16 MED ORDER — PROCHLORPERAZINE EDISYLATE 10 MG/2ML IJ SOLN
10.0000 mg | Freq: Once | INTRAMUSCULAR | Status: AC
  Administered 2018-01-16: 10 mg via INTRAMUSCULAR
  Filled 2018-01-16: qty 2

## 2018-01-16 MED ORDER — DEXAMETHASONE 4 MG PO TABS
10.0000 mg | ORAL_TABLET | Freq: Once | ORAL | Status: AC
  Administered 2018-01-16: 10 mg via ORAL
  Filled 2018-01-16: qty 3

## 2018-01-16 NOTE — Discharge Instructions (Signed)
Follow up with a neurologist.  Return for worsening pain, fever.

## 2018-01-16 NOTE — ED Triage Notes (Signed)
Patient reports migraine headache for 4 days with emesis , photophobia and blurred vision unrelieved by OTC pain medication . Denies head injury/no fever .
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
## 2018-09-02 ENCOUNTER — Other Ambulatory Visit: Payer: Self-pay

## 2018-09-02 ENCOUNTER — Emergency Department (HOSPITAL_COMMUNITY)
Admission: EM | Admit: 2018-09-02 | Discharge: 2018-09-03 | Disposition: A | Discharge status: Home / Self Care | Payer: Self-pay | Attending: Emergency Medicine | Admitting: Emergency Medicine

## 2018-09-02 ENCOUNTER — Encounter (HOSPITAL_COMMUNITY): Payer: Self-pay | Admitting: Emergency Medicine

## 2018-09-02 DIAGNOSIS — H538 Other visual disturbances: Secondary | ICD-10-CM | POA: Insufficient documentation

## 2018-09-02 DIAGNOSIS — H53149 Visual discomfort, unspecified: Secondary | ICD-10-CM | POA: Insufficient documentation

## 2018-09-02 DIAGNOSIS — R51 Headache: Secondary | ICD-10-CM | POA: Insufficient documentation

## 2018-09-02 DIAGNOSIS — F1290 Cannabis use, unspecified, uncomplicated: Secondary | ICD-10-CM | POA: Insufficient documentation

## 2018-09-02 DIAGNOSIS — R112 Nausea with vomiting, unspecified: Secondary | ICD-10-CM | POA: Insufficient documentation

## 2018-09-02 DIAGNOSIS — F17210 Nicotine dependence, cigarettes, uncomplicated: Secondary | ICD-10-CM | POA: Insufficient documentation

## 2018-09-02 DIAGNOSIS — R519 Headache, unspecified: Secondary | ICD-10-CM

## 2018-09-02 NOTE — ED Triage Notes (Signed)
Pt c/o recurrent migraines x 3 months. Pt reports taking tylenol and aleve without change. C/o nausea and sensitivity to light and sound.

## 2018-09-03 MED ORDER — DIPHENHYDRAMINE HCL 50 MG/ML IJ SOLN
12.5000 mg | Freq: Once | INTRAMUSCULAR | Status: AC
  Administered 2018-09-03: 01:00:00 12.5 mg via INTRAVENOUS
  Filled 2018-09-03: qty 1

## 2018-09-03 MED ORDER — PROCHLORPERAZINE EDISYLATE 10 MG/2ML IJ SOLN
10.0000 mg | Freq: Once | INTRAMUSCULAR | Status: AC
  Administered 2018-09-03: 01:00:00 10 mg via INTRAVENOUS
  Filled 2018-09-03: qty 2

## 2018-09-03 MED ORDER — SODIUM CHLORIDE 0.9 % IV BOLUS
1000.0000 mL | Freq: Once | INTRAVENOUS | Status: AC
  Administered 2018-09-03: 01:00:00 1000 mL via INTRAVENOUS
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
## 2018-09-03 NOTE — Discharge Instructions (Addendum)
1. Medications: tylenol or motrin for pain - do not exceed 500mg  of tylenol or 800mg  of ibuprofen in any single dose. Do not repeat dosing for 6 hours.  Usual home medications 2. Treatment: rest, drink plenty of fluids,  3. Follow Up: Please followup with your primary doctor and/or Neurologist in 7 days for discussion of your diagnoses and further evaluation after today's visit; if you do not have a primary care doctor use the resource guide provided to find one; Please return to the ER for new or worsening symptoms.
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
## 2018-09-07 ENCOUNTER — Other Ambulatory Visit: Payer: Self-pay

## 2018-09-07 ENCOUNTER — Ambulatory Visit (INDEPENDENT_AMBULATORY_CARE_PROVIDER_SITE_OTHER): Payer: Self-pay | Admitting: Neurology

## 2018-09-07 ENCOUNTER — Encounter: Payer: Self-pay | Admitting: Neurology

## 2018-09-07 ENCOUNTER — Telehealth: Payer: Self-pay

## 2018-09-07 ENCOUNTER — Telehealth: Payer: Self-pay | Admitting: Neurology

## 2018-09-07 DIAGNOSIS — G43711 Chronic migraine without aura, intractable, with status migrainosus: Secondary | ICD-10-CM

## 2018-09-07 NOTE — Telephone Encounter (Signed)
This patient did not logon for a WebEx new patient visit today, his telephone number is out of service, could not contact the patient.

## 2018-09-07 NOTE — Progress Notes (Signed)
Unable to reach the patient by telephone, the patient did not login for WebEx new patient visit today.

## 2018-09-07 NOTE — Telephone Encounter (Signed)
I attempted to reach the patient to complete the precharting for virtual visit scheduled for 09/07/18 with [facility]. I contacted the [phone] # and was advised this number has been changed or disconnected.

## 2018-09-15 ENCOUNTER — Ambulatory Visit (INDEPENDENT_AMBULATORY_CARE_PROVIDER_SITE_OTHER): Payer: Self-pay | Admitting: Neurology

## 2018-09-15 ENCOUNTER — Encounter: Payer: Self-pay | Admitting: Neurology

## 2018-09-15 ENCOUNTER — Telehealth: Payer: Self-pay | Admitting: *Deleted

## 2018-09-15 ENCOUNTER — Other Ambulatory Visit: Payer: Self-pay

## 2018-09-15 DIAGNOSIS — G43019 Migraine without aura, intractable, without status migrainosus: Secondary | ICD-10-CM

## 2018-09-15 HISTORY — DX: Migraine without aura, intractable, without status migrainosus: G43.019

## 2018-09-15 MED ORDER — TOPIRAMATE 25 MG PO TABS: ORAL_TABLET | ORAL | 3 refills | Status: DC

## 2018-09-15 MED ORDER — RIZATRIPTAN BENZOATE 10 MG PO TABS: 10.0000 mg | ORAL_TABLET | Freq: Three times a day (TID) | ORAL | 3 refills | Status: AC | PRN

## 2018-09-15 MED ORDER — RIZATRIPTAN BENZOATE 10 MG PO TABS: 10.0000 mg | ORAL_TABLET | Freq: Three times a day (TID) | ORAL | 3 refills | Status: DC | PRN

## 2018-09-15 NOTE — Progress Notes (Signed)
     Virtual Visit via Video Note  I connected with [name] on 09/15/18 at  3:00 PM EDT by a video enabled telemedicine application and verified that I am speaking with the correct person using two identifiers.   I discussed the limitations of evaluation and management by telemedicine and the availability of in person appointments. The patient expressed understanding and agreed to proceed.  History of Present Illness: [name] is a 38 year old right-handed black male with a history of headaches off and on throughout his life.  Over the last 3 months, the head is converted from being occasional events to almost daily events.  The headaches are mainly left-sided and may be associated with photophobia and phonophobia, the patient becomes very sensitive even to the movement.  He has to go into a dark room and closes eyes and get away from noise.  He may have some nausea but no vomiting.  He notes that the headache pain can be quite severe at times.  He works as a Financial risk analyst, he currently is out of work.  The patient has blurred vision with the events.  The patient denies any family history of headache.  In the past he has been placed on what sounds like Midrin for the headache that did not help much.  He takes Tylenol or ibuprofen over-the-counter, he may take 7 to 8 tablets a day without benefit.  The headaches will last at least 4 hours when they come on.  He denies any focal numbness or weakness of the face, arms, legs.  He knows of no other activating factors, he thought alcohol was not activated but he stopped drinking but it did not help the headache.   Observations/Objective: WebEx evaluation was limited due to malfunction, unable to visualize the patient after only a few moments.  He appears to have good facial symmetry, speech is normal, no aphasia or dysarthria is noted.  He appears to be oriented, answering questions appropriately.  Assessment and Plan: 1.  Common migraine, intractable  The  patient is having quite a bit of problems with intractable migraine headache.  He will be placed on Topamax working up to 75 mg nightly dose.  A prescription for Maxalt was given.  He will follow-up in 3 months.  The patient does not have a primary care physician.  Follow Up Instructions: 57-month follow-up, may see nurse practitioner.   I discussed the assessment and treatment plan with the patient. The patient was provided an opportunity to ask questions and all were answered. The patient agreed with the plan and demonstrated an understanding of the instructions.   The patient was advised to call back or seek an in-person evaluation if the symptoms worsen or if the condition fails to improve as anticipated.  I provided 25 minutes of non-face-to-face time during this encounter.   York Spaniel, MD

## 2018-09-15 NOTE — Telephone Encounter (Signed)
Patient states the medication that was prescribed is very expensive.  Is there a less expensive one he could try?  Please call to discuss.  If there is he wants it sent to [facility]

## 2018-09-15 NOTE — Telephone Encounter (Signed)
I contacted the pt. He states his medications were going to cost over [facility].  I looked on good [facility] and website states [facility] would be cheaper. I have sent order and advised pt our office will be accepting calls on 09/16/18 starting at 8 Pt was advised for him to call if he had any issues picking his medication up. Pt was agreeable.

## 2018-09-20 ENCOUNTER — Telehealth: Payer: Self-pay | Admitting: Neurology

## 2018-09-20 NOTE — Telephone Encounter (Signed)
Pt returned my call. He is concerned about possible s/e to topamax. He has not started topamax yet and was confused on how to take it. I reminded him that this is a daily migraine prevention medication. He was under the assumption he should only take it if he has a migraine. I advised him that the maxalt is the PRN medication for if he has a migraine.   Pt is still concerned about possible s/e from the topamax and would like a call from Dr. Anne Hahn to discuss further. He is particularly worried about the cognitive side effects and seizures.

## 2018-09-20 NOTE — Telephone Encounter (Signed)
I called pt to discuss his concerns regarding topamax. No answer, left a message asking him to call me back.

## 2018-09-20 NOTE — Telephone Encounter (Signed)
Pt has been reading the side effects to topiramate (TOPAMAX) 25 MG tablet he is asking RN to call him to address his concerns

## 2018-09-21 NOTE — Telephone Encounter (Signed)
I called the patient.  I went over the potential side effects of the Topamax, I recommended that he start the drug, if he does have side effects he is to contact our office.  If he tolerates the medication but the headache is still a problem, he is to contact our office and we will increase the dose of the Topamax.

## 2018-09-22 ENCOUNTER — Other Ambulatory Visit: Payer: Self-pay

## 2018-09-22 ENCOUNTER — Emergency Department (HOSPITAL_COMMUNITY)
Admission: EM | Admit: 2018-09-22 | Discharge: 2018-09-22 | Disposition: A | Discharge status: Home / Self Care | Payer: Self-pay | Attending: Emergency Medicine | Admitting: Emergency Medicine

## 2018-09-22 DIAGNOSIS — F17210 Nicotine dependence, cigarettes, uncomplicated: Secondary | ICD-10-CM | POA: Insufficient documentation

## 2018-09-22 DIAGNOSIS — G43909 Migraine, unspecified, not intractable, without status migrainosus: Secondary | ICD-10-CM

## 2018-09-22 DIAGNOSIS — H538 Other visual disturbances: Secondary | ICD-10-CM | POA: Insufficient documentation

## 2018-09-22 DIAGNOSIS — F1290 Cannabis use, unspecified, uncomplicated: Secondary | ICD-10-CM | POA: Insufficient documentation

## 2018-09-22 MED ORDER — PROCHLORPERAZINE EDISYLATE 10 MG/2ML IJ SOLN
10.0000 mg | Freq: Once | INTRAMUSCULAR | Status: AC
  Administered 2018-09-22: 18:00:00 10 mg via INTRAVENOUS
  Filled 2018-09-22: qty 2

## 2018-09-22 MED ORDER — DIPHENHYDRAMINE HCL 50 MG/ML IJ SOLN
25.0000 mg | Freq: Once | INTRAMUSCULAR | Status: AC
  Administered 2018-09-22: 18:00:00 25 mg via INTRAVENOUS
  Filled 2018-09-22: qty 1

## 2018-09-22 MED ORDER — KETOROLAC TROMETHAMINE 15 MG/ML IJ SOLN
15.0000 mg | Freq: Once | INTRAMUSCULAR | Status: AC
  Administered 2018-09-22: 15 mg via INTRAVENOUS
  Filled 2018-09-22: qty 1

## 2018-09-22 MED ORDER — METHYLPREDNISOLONE SODIUM SUCC 125 MG IJ SOLR
125.0000 mg | Freq: Once | INTRAMUSCULAR | Status: AC
  Administered 2018-09-22: 125 mg via INTRAVENOUS
  Filled 2018-09-22: qty 2

## 2018-09-22 MED ORDER — SODIUM CHLORIDE 0.9 % IV BOLUS (SEPSIS)
1000.0000 mL | Freq: Once | INTRAVENOUS | Status: AC
  Administered 2018-09-22: 18:00:00 1000 mL via INTRAVENOUS
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
## 2018-09-22 NOTE — Discharge Instructions (Signed)
Thank you for allowing me to care for you today. Please return to the emergency department if you have new or worsening symptoms. Take your medications as instructed.  ° °

## 2018-09-22 NOTE — ED Triage Notes (Signed)
Pt c/o a migraine headache that began about 2 hours ago ; hx of migraines ; has seen neurologist and was prescribed Rizatriptan and has not had any relief; no other symptoms

## 2018-09-22 NOTE — ED Notes (Signed)
Patient verbalizes understanding of discharge instructions. Opportunity for questioning and answering were provided.  patient discharged from ED.  

## 2018-09-25 ENCOUNTER — Telehealth: Payer: Self-pay | Admitting: Neurology

## 2018-09-25 MED ORDER — TOPIRAMATE 25 MG PO TABS: ORAL_TABLET | ORAL | 3 refills | Status: AC

## 2018-09-25 MED ORDER — DICLOFENAC POTASSIUM 50 MG PO TABS: 50.0000 mg | ORAL_TABLET | Freq: Three times a day (TID) | ORAL | 2 refills | Status: DC | PRN

## 2018-09-25 NOTE — Telephone Encounter (Signed)
I called the patient.  The patient has not started the Topamax, he has gone through 10 of the Maxalt in a week.  I will call in diclofenac potassium, he is to start the Topamax.  He did not start the medication because he was concerned that he might have side effects.

## 2018-10-05 ENCOUNTER — Telehealth (HOSPITAL_COMMUNITY): Payer: Self-pay | Admitting: Neurology

## 2018-10-05 ENCOUNTER — Telehealth: Payer: Self-pay

## 2018-10-05 MED ORDER — TOPIRAMATE 25 MG PO TABS: 25.0000 mg | ORAL_TABLET | Freq: Every day | ORAL | 2 refills | Status: DC

## 2018-10-05 NOTE — Telephone Encounter (Signed)
Patient called wanting to know is there a cheaper medication for him to take for his migraines. He stated that he is out of work and cannot afford to pay the high price tag. Please advise.

## 2018-10-05 NOTE — Telephone Encounter (Signed)
Left a vm with the pt's wife and requested a call back from the pt to further discuss.

## 2018-10-05 NOTE — Telephone Encounter (Signed)
The patient called again this evening stating he needed cheap medication for his headaches and could not afford expensice ones. I reviewed his chart and noticed that Dr Anne Hahn had prescribed topamax but he did not try it due to fear of SEs. I assured him that ifhe started at 25 mg those were likeley to be minimal and also Topamax is available in generic form which he can afford . He voiced understanding. I will send message to Dr Anne Hahn to call him in morning for further directions

## 2018-10-05 NOTE — Telephone Encounter (Signed)
Late Entry  I have received a call from patient on 10/04/18 at around 6 30 pm stating he could not afford diclofenac which Dr Anne Hahn had called in for him and wanted a cheaper medicine. I advised him to take tylenol extrastrength or motrin for tonight and call office in morning for further advice from Dr Anne Hahn. He voiced understanding

## 2018-10-06 ENCOUNTER — Telehealth: Payer: Self-pay | Admitting: Neurology

## 2018-10-06 NOTE — Telephone Encounter (Signed)
Noted  

## 2018-10-06 NOTE — Telephone Encounter (Signed)
Pt returned call and was informed of the providers advise. Pt was not happy and hung up.

## 2018-10-06 NOTE — Telephone Encounter (Signed)
Brian Spaniel, MD 4 hours ago (7:09 AM)     I called the patient.  The patient may take Advil over-the-counter 600 to 800 mg at a time up to 3 times daily if needed for the headache.  I also indicated that he must try the medications that we prescribe as a preventative drug, you cannot treat frequent migraine headache just with rescue drugs.  I urged him to try the Topamax, if he does have side effects we will try something different.  I cannot help him if he does not follow recommendations.     Dr. Anne Hahn called the pt and advised of above recommendations. Pt apparently called back and Jael advised message. She states the pt was not happy about advise and hung up.

## 2018-10-06 NOTE — Telephone Encounter (Signed)
I called patient.  He took his first Topamax dose last night.  He can take Advil if needed for the headache, diclofenac potassium was very effective for the headache but it is expensive.  He will increase the Topamax to 50 mg at night after 1 week, then go to 75 mg at night.  He will call me for any dose adjustments from there on.

## 2018-10-06 NOTE — Telephone Encounter (Signed)
Attempted to reach the pt. Phone line rang twice and then d/c. Was unable to speak to pt or leave vm.

## 2018-10-06 NOTE — Telephone Encounter (Signed)
I called the patient.  The patient may take Advil over-the-counter 600 to 800 mg at a time up to 3 times daily if needed for the headache.  I also indicated that he must try the medications that we prescribe as a preventative drug, you cannot treat frequent migraine headache just with rescue drugs.  I urged him to try the Topamax, if he does have side effects we will try something different.  I cannot help him if he does not follow recommendations.
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
## 2018-12-14 ENCOUNTER — Ambulatory Visit: Payer: Self-pay | Admitting: Neurology

## 2018-12-14 ENCOUNTER — Telehealth: Payer: Self-pay

## 2018-12-14 NOTE — Telephone Encounter (Signed)
Patient was a no call/no show for their appointment today.   

## 2019-09-22 ENCOUNTER — Other Ambulatory Visit: Payer: Self-pay

## 2019-09-22 ENCOUNTER — Encounter (HOSPITAL_COMMUNITY): Payer: Self-pay | Admitting: Pediatrics

## 2019-09-22 ENCOUNTER — Emergency Department (HOSPITAL_COMMUNITY)
Admission: EM | Admit: 2019-09-22 | Discharge: 2019-09-22 | Disposition: A | Discharge status: Home / Self Care | Payer: Self-pay | Attending: Emergency Medicine | Admitting: Emergency Medicine

## 2019-09-22 DIAGNOSIS — R112 Nausea with vomiting, unspecified: Secondary | ICD-10-CM | POA: Insufficient documentation

## 2019-09-22 DIAGNOSIS — R197 Diarrhea, unspecified: Secondary | ICD-10-CM | POA: Insufficient documentation

## 2019-09-22 DIAGNOSIS — Z5321 Procedure and treatment not carried out due to patient leaving prior to being seen by health care provider: Secondary | ICD-10-CM | POA: Insufficient documentation

## 2019-09-22 LAB — CBC
HCT: 36.6 % — ABNORMAL LOW (ref 39.0–52.0)
Hemoglobin: 11.7 g/dL — ABNORMAL LOW (ref 13.0–17.0)
MCH: 28.2 pg (ref 26.0–34.0)
MCHC: 32 g/dL (ref 30.0–36.0)
MCV: 88.2 fL (ref 80.0–100.0)
Platelets: 255 10*3/uL (ref 150–400)
RBC: 4.15 MIL/uL — ABNORMAL LOW (ref 4.22–5.81)
RDW: 12.9 % (ref 11.5–15.5)
WBC: 14.6 10*3/uL — ABNORMAL HIGH (ref 4.0–10.5)
nRBC: 0 % (ref 0.0–0.2)

## 2019-09-22 LAB — COMPREHENSIVE METABOLIC PANEL
ALT: 10 U/L (ref 0–44)
AST: 21 U/L (ref 15–41)
Albumin: 3.9 g/dL (ref 3.5–5.0)
Alkaline Phosphatase: 54 U/L (ref 38–126)
Anion gap: 8 (ref 5–15)
BUN: 10 mg/dL (ref 6–20)
CO2: 28 mmol/L (ref 22–32)
Calcium: 9.4 mg/dL (ref 8.9–10.3)
Chloride: 106 mmol/L (ref 98–111)
Creatinine, Ser: 0.94 mg/dL (ref 0.61–1.24)
GFR calc Af Amer: >60 mL/min (ref >60)
GFR calc non Af Amer: >60 mL/min (ref >60)
Glucose, Bld: 107 mg/dL — ABNORMAL HIGH (ref 70–99)
Potassium: 3.4 mmol/L — ABNORMAL LOW (ref 3.5–5.1)
Sodium: 142 mmol/L (ref 135–145)
Total Bilirubin: 0.5 mg/dL (ref 0.3–1.2)
Total Protein: 7 g/dL (ref 6.5–8.1)

## 2019-09-22 LAB — LIPASE, BLOOD: Lipase: 24 U/L (ref 11–51)

## 2019-09-22 MED ORDER — SODIUM CHLORIDE 0.9% FLUSH: 3.0000 mL | Freq: Once | INTRAVENOUS | Status: DC

## 2019-09-22 MED ORDER — ONDANSETRON 4 MG PO TBDP
4.0000 mg | ORAL_TABLET | Freq: Once | ORAL | Status: AC | PRN
  Administered 2019-09-22: 13:00:00 4 mg via ORAL
  Filled 2019-09-22: qty 1

## 2019-09-22 NOTE — ED Triage Notes (Signed)
Patient c/o NVD since last nigh; patient concern for something he ate before syptoms started

## 2019-09-22 NOTE — ED Notes (Signed)
Called for pt twice with no response 

## 2020-06-27 ENCOUNTER — Emergency Department (HOSPITAL_COMMUNITY)
Admission: EM | Admit: 2020-06-27 | Discharge: 2020-06-27 | Disposition: A | Discharge status: Home / Self Care | Payer: HRSA Program | Attending: Emergency Medicine | Admitting: Emergency Medicine

## 2020-06-27 ENCOUNTER — Encounter (HOSPITAL_COMMUNITY): Payer: Self-pay | Admitting: Emergency Medicine

## 2020-06-27 ENCOUNTER — Other Ambulatory Visit: Payer: Self-pay

## 2020-06-27 DIAGNOSIS — B349 Viral infection, unspecified: Secondary | ICD-10-CM

## 2020-06-27 DIAGNOSIS — R059 Cough, unspecified: Secondary | ICD-10-CM | POA: Diagnosis present

## 2020-06-27 DIAGNOSIS — U071 COVID-19: Secondary | ICD-10-CM | POA: Insufficient documentation

## 2020-06-27 DIAGNOSIS — R110 Nausea: Secondary | ICD-10-CM | POA: Diagnosis not present

## 2020-06-27 DIAGNOSIS — F17210 Nicotine dependence, cigarettes, uncomplicated: Secondary | ICD-10-CM | POA: Diagnosis not present

## 2020-06-27 DIAGNOSIS — R630 Anorexia: Secondary | ICD-10-CM | POA: Insufficient documentation

## 2020-06-27 LAB — SARS CORONAVIRUS 2 (TAT 6-24 HRS): SARS Coronavirus 2: POSITIVE — AB

## 2020-06-27 MED ORDER — ONDANSETRON 4 MG PO TBDP: 4.0000 mg | ORAL_TABLET | Freq: Three times a day (TID) | ORAL | 0 refills | Status: DC | PRN

## 2020-06-27 MED ORDER — KETOROLAC TROMETHAMINE 60 MG/2ML IM SOLN
60.0000 mg | Freq: Once | INTRAMUSCULAR | Status: AC
  Administered 2020-06-27: 60 mg via INTRAMUSCULAR
  Filled 2020-06-27: qty 2

## 2020-06-27 MED ORDER — ONDANSETRON 4 MG PO TBDP
4.0000 mg | ORAL_TABLET | Freq: Once | ORAL | Status: AC
  Administered 2020-06-27: 4 mg via ORAL
  Filled 2020-06-27: qty 1

## 2020-06-27 NOTE — Discharge Instructions (Signed)
Your history and physical exam is suggestive of a viral illness.  You have been tested for COVID-19.    Please maintain isolation precautions.  Check your temperature regularly and take Tylenol as needed for fever control.  Increase your oral hydration and continue to eat regular meals to avoid electrolyte derangement and further fatigue.  I recommend over-the-counter medications as needed for symptom relief.  You may wish to consider obtaining a pulse oximeter over-the-counter at a pharmacy.  If you are below 90% oxygen saturation, you will need to return to the ED.   I have prescribed you Zofran ODT which you take as needed for nausea symptoms.  Follow-up with your primary care provider regarding today's encounter and for ongoing management.  If you do not have one, please get established with one as soon as possible.  If you do not have insurance, please consider the [facility] and [facility].  Return to the ED or seek immediate medical attention should you experience any new or worsening symptoms.    If you have additional questions, contact your local health department or call the epidemiologist on call at [phone] (available 24/7). ? This guidance is subject to change. For the most up-to-date guidance from [facility], please refer to their website: [email or web address]
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
## 2020-06-27 NOTE — ED Notes (Signed)
Patient discharge instructions reviewed with the patient. The patient verbalized understanding of instructions. Patient discharged. 

## 2020-06-27 NOTE — ED Triage Notes (Signed)
Patient complaint of bodyaches and cough. VSS.

## 2020-06-28 ENCOUNTER — Telehealth: Payer: Self-pay

## 2020-06-28 NOTE — Telephone Encounter (Signed)
Patient called back and says his call dropped while talking to someone about his test results. I asked COVID results, he says yes. I advised the results as positive and advised I will send the message below for reference. Reviewed the information below with the patient, he verbalized understanding. He asks will he be able to show his employer this, I advised yes and may be able to print, since he has MyChart. He verbalized understanding.  You have viewed your COVID-19 result in MyChart. Your test was POSITIVE/"Detected", meaning that you were infected with the novel coronavirus and could give the germ to others.  Criteria for self-isolation if you test positive for COVID-19, regardless of vaccination status:  -If you have mild symptoms that are resolving or have resolved, isolate at home for 5 days since symptoms started AND continue to wear a well-fitted mask when around others in the home and in public for 5 additional days after isolation is completed -If you have a fever and/or moderate to severe symptoms, isolate for at least 10 days since the symptoms started AND until you are fever free for at least 24 hours without the use of fever-reducing medications -If you tested positive and did not have symptoms, isolate for at least 5 days after your positive test  Use over-the-counter medications for symptoms.If you develop respiratory issues/distress, seek medical care in the Emergency Department.  If you must leave home or if you have to be around others please wear a mask. Please limit contact with immediate family members in the home, practice social distancing, frequent handwashing and clean hard surfaces touched frequently with household cleaning products. Members of your household will also need to quarantine and test.You may also be contacted by the health department for follow up. Please call [place] at [phone] if you have any questions or concerns.

## 2020-06-28 NOTE — Telephone Encounter (Signed)
Patient called to verify his COVID-19 test result. He was told that it was positive done 06/27/20. Patient ended conversation and I was unable to complete education.

## 2020-08-27 ENCOUNTER — Encounter (HOSPITAL_COMMUNITY): Payer: Self-pay

## 2020-08-27 ENCOUNTER — Other Ambulatory Visit: Payer: Self-pay

## 2020-08-27 ENCOUNTER — Emergency Department (HOSPITAL_COMMUNITY)
Admission: EM | Admit: 2020-08-27 | Discharge: 2020-08-27 | Disposition: A | Discharge status: Home / Self Care | Payer: Self-pay | Attending: Emergency Medicine | Admitting: Emergency Medicine

## 2020-08-27 DIAGNOSIS — G43519 Persistent migraine aura without cerebral infarction, intractable, without status migrainosus: Secondary | ICD-10-CM | POA: Insufficient documentation

## 2020-08-27 DIAGNOSIS — G43509 Persistent migraine aura without cerebral infarction, not intractable, without status migrainosus: Secondary | ICD-10-CM

## 2020-08-27 DIAGNOSIS — F17210 Nicotine dependence, cigarettes, uncomplicated: Secondary | ICD-10-CM | POA: Insufficient documentation

## 2020-08-27 DIAGNOSIS — F1990 Other psychoactive substance use, unspecified, uncomplicated: Secondary | ICD-10-CM

## 2020-08-27 LAB — URINALYSIS, ROUTINE W REFLEX MICROSCOPIC
Bacteria, UA: NONE SEEN
Bilirubin Urine: NEGATIVE
Glucose, UA: NEGATIVE mg/dL
Ketones, ur: NEGATIVE mg/dL
Leukocytes,Ua: NEGATIVE
Nitrite: NEGATIVE
Protein, ur: NEGATIVE mg/dL
Specific Gravity, Urine: 1.018 (ref 1.005–1.030)
pH: 5 (ref 5.0–8.0)

## 2020-08-27 LAB — BASIC METABOLIC PANEL
Anion gap: 7 (ref 5–15)
BUN: 8 mg/dL (ref 6–20)
CO2: 27 mmol/L (ref 22–32)
Calcium: 9.1 mg/dL (ref 8.9–10.3)
Chloride: 106 mmol/L (ref 98–111)
Creatinine, Ser: 0.91 mg/dL (ref 0.61–1.24)
GFR, Estimated: >60 mL/min (ref >60)
Glucose, Bld: 92 mg/dL (ref 70–99)
Potassium: 3.6 mmol/L (ref 3.5–5.1)
Sodium: 140 mmol/L (ref 135–145)

## 2020-08-27 LAB — RAPID URINE DRUG SCREEN, HOSP PERFORMED
Amphetamines: NOT DETECTED
Barbiturates: NOT DETECTED
Benzodiazepines: NOT DETECTED
Cocaine: POSITIVE — AB
Opiates: NOT DETECTED
Tetrahydrocannabinol: POSITIVE — AB

## 2020-08-27 LAB — CBC WITH DIFFERENTIAL/PLATELET
Abs Immature Granulocytes: 0.04 10*3/uL (ref 0.00–0.07)
Basophils Absolute: 0.1 10*3/uL (ref 0.0–0.1)
Basophils Relative: 1 %
Eosinophils Absolute: 0.4 10*3/uL (ref 0.0–0.5)
Eosinophils Relative: 4 %
HCT: 37.1 % — ABNORMAL LOW (ref 39.0–52.0)
Hemoglobin: 11.7 g/dL — ABNORMAL LOW (ref 13.0–17.0)
Immature Granulocytes: 0 %
Lymphocytes Relative: 16 %
Lymphs Abs: 1.6 10*3/uL (ref 0.7–4.0)
MCH: 28.3 pg (ref 26.0–34.0)
MCHC: 31.5 g/dL (ref 30.0–36.0)
MCV: 89.8 fL (ref 80.0–100.0)
Monocytes Absolute: 0.9 10*3/uL (ref 0.1–1.0)
Monocytes Relative: 9 %
Neutro Abs: 7.4 10*3/uL (ref 1.7–7.7)
Neutrophils Relative %: 70 %
Platelets: 240 10*3/uL (ref 150–400)
RBC: 4.13 MIL/uL — ABNORMAL LOW (ref 4.22–5.81)
RDW: 12.9 % (ref 11.5–15.5)
WBC: 10.4 10*3/uL (ref 4.0–10.5)
nRBC: 0 % (ref 0.0–0.2)

## 2020-08-27 NOTE — ED Notes (Signed)
No change from triage note: "Patient complains of chronic headaches for months and has been worked up for same. States that he was sent from work to make sure he is ok to work in [employer], [employer], [employer]" Pt states he has had to call out a couple of times due to stomach problems and headaches and now he cannot go back to work without a doctor's note Denies any HA or stomach ache now

## 2020-08-27 NOTE — Discharge Instructions (Addendum)
Mr. Blakeman, it was a pleasure taking care of you. The headaches you describe as classic for migraines.  Please stop using BC powders.  Instead, please try ibuprofen/Aleve 800 mg up to 3 times a day, Tylenol 1000 mg up to 4 times a day, and resting in a dark room.  You may also try the Maxalt which you previously prescribed.  I have placed a referral to your prior neurologist, please see them and consider starting a daily medication.  Let them know that you did not start the Topamax because you were nervous about the side effects, there are several other medications they can try for migraine prevention.  Your lab work showed a mildly low blood counts, likely from the [facility] powder use.  It is important to avoid this in the future to prevent more serious stomach ulcers.  Your blood pressure was mildly high here today, but has been mostly normal in the past.  I am attaching a resource guide with several low-cost or free primary care providers, please establish with one of them to follow-up on your blood pressure.

## 2020-08-27 NOTE — ED Notes (Signed)
ED Provider at bedside, dr Imogene Burn
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
## 2020-08-27 NOTE — ED Triage Notes (Signed)
Patient complains of chronic headaches for months and has been worked up for same. States that he was sent from work to make sure he is ok to work in [employer], NAD

## 2020-11-25 ENCOUNTER — Emergency Department (HOSPITAL_COMMUNITY): Payer: Self-pay

## 2020-11-25 ENCOUNTER — Emergency Department (HOSPITAL_COMMUNITY)
Admission: EM | Admit: 2020-11-25 | Discharge: 2020-11-25 | Disposition: A | Discharge status: Home / Self Care | Payer: Self-pay | Attending: Physician Assistant | Admitting: Physician Assistant

## 2020-11-25 DIAGNOSIS — U071 COVID-19: Secondary | ICD-10-CM | POA: Insufficient documentation

## 2020-11-25 DIAGNOSIS — Z8616 Personal history of COVID-19: Secondary | ICD-10-CM | POA: Insufficient documentation

## 2020-11-25 LAB — SARS CORONAVIRUS 2 (TAT 6-24 HRS): SARS Coronavirus 2: POSITIVE — AB

## 2020-11-25 NOTE — ED Provider Notes (Signed)
Emergency Medicine Provider Triage Evaluation Note  Sher Hellinger , a 40 y.o. male  was evaluated in triage.  Pt complains of sweats and chills. Had some cough earlier this week that has improved. Denies abd pain.  Review of Systems  Positive: Cough, chills, sweats Negative: Abd pain  Physical Exam  BP (!) 137/94 (BP Location: Right Arm)   Pulse 74   Temp 99 F (37.2 C) (Oral)   Resp 17   SpO2 97%  Gen:   Awake, no distress   Resp:  Normal effort  MSK:   Moves extremities without difficulty   Medical Decision Making  Medically screening exam initiated at 12:58 PM.  Appropriate orders placed.  Brayden Blaylock was informed that the remainder of the evaluation will be completed by another provider, this initial triage assessment does not replace that evaluation, and the importance of remaining in the ED until their evaluation is complete.     Karrie Meres, PA-C 11/25/20 1300    Rozelle Logan, DO 11/28/20 1115

## 2020-11-25 NOTE — ED Triage Notes (Signed)
Pt here from home with c/o gen body aches chills ,

## 2020-11-25 NOTE — ED Notes (Signed)
Patient stated that he was not staying.

## 2021-02-03 ENCOUNTER — Emergency Department (HOSPITAL_COMMUNITY)
Admission: EM | Admit: 2021-02-03 | Discharge: 2021-02-03 | Disposition: A | Discharge status: Home / Self Care | Payer: Self-pay | Attending: Emergency Medicine | Admitting: Emergency Medicine

## 2021-02-03 ENCOUNTER — Other Ambulatory Visit: Payer: Self-pay

## 2021-02-03 DIAGNOSIS — Z20822 Contact with and (suspected) exposure to covid-19: Secondary | ICD-10-CM | POA: Insufficient documentation

## 2021-02-03 DIAGNOSIS — R1013 Epigastric pain: Secondary | ICD-10-CM | POA: Insufficient documentation

## 2021-02-03 DIAGNOSIS — R112 Nausea with vomiting, unspecified: Secondary | ICD-10-CM | POA: Insufficient documentation

## 2021-02-03 DIAGNOSIS — R109 Unspecified abdominal pain: Secondary | ICD-10-CM

## 2021-02-03 DIAGNOSIS — F17210 Nicotine dependence, cigarettes, uncomplicated: Secondary | ICD-10-CM | POA: Insufficient documentation

## 2021-02-03 DIAGNOSIS — R6883 Chills (without fever): Secondary | ICD-10-CM | POA: Insufficient documentation

## 2021-02-03 DIAGNOSIS — R1012 Left upper quadrant pain: Secondary | ICD-10-CM | POA: Insufficient documentation

## 2021-02-03 DIAGNOSIS — R197 Diarrhea, unspecified: Secondary | ICD-10-CM | POA: Insufficient documentation

## 2021-02-03 LAB — CBC WITH DIFFERENTIAL/PLATELET
Abs Immature Granulocytes: 0.07 10*3/uL (ref 0.00–0.07)
Basophils Absolute: 0.1 10*3/uL (ref 0.0–0.1)
Basophils Relative: 0 %
Eosinophils Absolute: 0 10*3/uL (ref 0.0–0.5)
Eosinophils Relative: 0 %
HCT: 37.3 % — ABNORMAL LOW (ref 39.0–52.0)
Hemoglobin: 12.5 g/dL — ABNORMAL LOW (ref 13.0–17.0)
Immature Granulocytes: 0 %
Lymphocytes Relative: 7 %
Lymphs Abs: 1.1 10*3/uL (ref 0.7–4.0)
MCH: 28.3 pg (ref 26.0–34.0)
MCHC: 33.5 g/dL (ref 30.0–36.0)
MCV: 84.4 fL (ref 80.0–100.0)
Monocytes Absolute: 0.6 10*3/uL (ref 0.1–1.0)
Monocytes Relative: 4 %
Neutro Abs: 14.1 10*3/uL — ABNORMAL HIGH (ref 1.7–7.7)
Neutrophils Relative %: 89 %
Platelets: 291 10*3/uL (ref 150–400)
RBC: 4.42 MIL/uL (ref 4.22–5.81)
RDW: 12.4 % (ref 11.5–15.5)
WBC: 16 10*3/uL — ABNORMAL HIGH (ref 4.0–10.5)
nRBC: 0 % (ref 0.0–0.2)

## 2021-02-03 LAB — URINALYSIS, ROUTINE W REFLEX MICROSCOPIC
Bilirubin Urine: NEGATIVE
Glucose, UA: NEGATIVE mg/dL
Hgb urine dipstick: NEGATIVE
Ketones, ur: 20 mg/dL — AB
Leukocytes,Ua: NEGATIVE
Nitrite: NEGATIVE
Protein, ur: NEGATIVE mg/dL
Specific Gravity, Urine: 1.018 (ref 1.005–1.030)
pH: 9 — ABNORMAL HIGH (ref 5.0–8.0)

## 2021-02-03 LAB — COMPREHENSIVE METABOLIC PANEL
ALT: 19 U/L (ref 0–44)
AST: 31 U/L (ref 15–41)
Albumin: 4 g/dL (ref 3.5–5.0)
Alkaline Phosphatase: 61 U/L (ref 38–126)
Anion gap: 12 (ref 5–15)
BUN: 10 mg/dL (ref 6–20)
CO2: 20 mmol/L — ABNORMAL LOW (ref 22–32)
Calcium: 9.6 mg/dL (ref 8.9–10.3)
Chloride: 108 mmol/L (ref 98–111)
Creatinine, Ser: 1.01 mg/dL (ref 0.61–1.24)
GFR, Estimated: >60 mL/min (ref >60)
Glucose, Bld: 113 mg/dL — ABNORMAL HIGH (ref 70–99)
Potassium: 3.2 mmol/L — ABNORMAL LOW (ref 3.5–5.1)
Sodium: 140 mmol/L (ref 135–145)
Total Bilirubin: 0.8 mg/dL (ref 0.3–1.2)
Total Protein: 7.4 g/dL (ref 6.5–8.1)

## 2021-02-03 LAB — RESP PANEL BY RT-PCR (FLU A&B, COVID) ARPGX2
Influenza A by PCR: NEGATIVE
Influenza B by PCR: NEGATIVE
SARS Coronavirus 2 by RT PCR: NEGATIVE

## 2021-02-03 LAB — LIPASE, BLOOD: Lipase: 25 U/L (ref 11–51)

## 2021-02-03 MED ORDER — HALOPERIDOL LACTATE 5 MG/ML IJ SOLN
5.0000 mg | Freq: Once | INTRAMUSCULAR | Status: AC
  Administered 2021-02-03: 5 mg via INTRAVENOUS
  Filled 2021-02-03: qty 1

## 2021-02-03 MED ORDER — METOCLOPRAMIDE HCL 5 MG/ML IJ SOLN
10.0000 mg | Freq: Once | INTRAMUSCULAR | Status: AC
  Administered 2021-02-03: 10 mg via INTRAVENOUS
  Filled 2021-02-03: qty 2

## 2021-02-03 MED ORDER — LACTATED RINGERS IV BOLUS
1000.0000 mL | Freq: Once | INTRAVENOUS | Status: AC
  Administered 2021-02-03: 1000 mL via INTRAVENOUS

## 2021-02-03 MED ORDER — ONDANSETRON 4 MG PO TBDP
4.0000 mg | ORAL_TABLET | Freq: Once | ORAL | Status: AC
  Administered 2021-02-03: 4 mg via ORAL
  Filled 2021-02-03: qty 1
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
## 2021-02-03 NOTE — Discharge Instructions (Addendum)
You were seen in the emergency department for abdominal pain and vomiting. Your symptoms may be from a stomach virus. Alternatively, this may be something called "cannabinoid hyperemesis" which is cyclic vomiting related to your marijuana use. Please consider finding a primary care doctor who you can see on a regular basis. It is important to stay hydrated.  Reasons to return to the ED: -blood in your vomit/stool -passing out -persistent vomiting and inability to eat/drink for >24-48 hours

## 2021-02-03 NOTE — ED Notes (Signed)
Unable to obtain signature at time of discharge due to no signature pad available for hall bed. RN provided discharge teaching and pt verbalized understanding, noting that he will come back if his symptoms worsen or change.

## 2021-02-03 NOTE — ED Notes (Signed)
Pt is resting comfortably in NAD at this time. No vomiting noted

## 2021-02-03 NOTE — ED Triage Notes (Signed)
Pt c/o N/V, abd pain since 0900. Emesis x15, states he's "down to bile." Unsure if he ate something last night, "wants medicine to feel better." Denies sick contact, vaccinated but unboosted. States "I can't breathe," speaking in full sentences, RR even, unlabored. States head is spinning.

## 2021-02-03 NOTE — ED Triage Notes (Signed)
Pt also endorses 4 episodes of diarrhea

## 2021-02-03 NOTE — ED Provider Notes (Signed)
Emergency Medicine Provider Triage Evaluation Note  Hamlet Lasecki , a 40 y.o. male  was evaluated in triage.  Pt complains of vomiting since 9 AM.  He states that he has vomited about 15 times and had about 4 episodes of diarrhea.  He reports centralized abdominal pain.  He denies any known fevers.  He denies any alcohol use  Review of Systems  Positive: Vomiting, diarrhea, light headed, abdominal pain Negative: Fevers, shortness of breath  Physical Exam  BP 122/78 (BP Location: Right Arm)   Pulse 64   Temp 98.1 F (36.7 C)   Resp 20   SpO2 100%  Gen:   Awake, loudly dry heaving Resp:  Normal effort  MSK:   Moves extremities without difficulty  Other:  Moves all 4 extremtiies with out difficulty.   Medical Decision Making  Medically screening exam initiated at 4:13 PM.  Appropriate orders placed.  Jadan Shouse was informed that the remainder of the evaluation will be completed by another provider, this initial triage assessment does not replace that evaluation, and the importance of remaining in the ED until their evaluation is complete.  Note: Portions of this report may have been transcribed using voice recognition software. Every effort was made to ensure accuracy; however, inadvertent computerized transcription errors may be present     Norman Clay 02/03/21 1617    Benjiman Core, MD 02/04/21 [phone]

## 2021-04-11 ENCOUNTER — Emergency Department (HOSPITAL_COMMUNITY)
Admission: EM | Admit: 2021-04-11 | Discharge: 2021-04-11 | Disposition: A | Discharge status: Home / Self Care | Payer: Self-pay | Attending: Emergency Medicine | Admitting: Emergency Medicine

## 2021-04-11 DIAGNOSIS — J111 Influenza due to unidentified influenza virus with other respiratory manifestations: Secondary | ICD-10-CM

## 2021-04-11 DIAGNOSIS — F17210 Nicotine dependence, cigarettes, uncomplicated: Secondary | ICD-10-CM | POA: Insufficient documentation

## 2021-04-11 DIAGNOSIS — J101 Influenza due to other identified influenza virus with other respiratory manifestations: Secondary | ICD-10-CM | POA: Insufficient documentation

## 2021-04-11 DIAGNOSIS — Z20822 Contact with and (suspected) exposure to covid-19: Secondary | ICD-10-CM | POA: Insufficient documentation

## 2021-04-11 LAB — RESP PANEL BY RT-PCR (FLU A&B, COVID) ARPGX2
Influenza A by PCR: POSITIVE — AB
Influenza B by PCR: NEGATIVE
SARS Coronavirus 2 by RT PCR: NEGATIVE

## 2021-04-11 MED ORDER — ONDANSETRON HCL 4 MG PO TABS
4.0000 mg | ORAL_TABLET | Freq: Once | ORAL | Status: AC
  Administered 2021-04-11: 4 mg via ORAL
  Filled 2021-04-11: qty 1

## 2021-04-11 MED ORDER — ONDANSETRON 4 MG PO TBDP: 4.0000 mg | ORAL_TABLET | Freq: Three times a day (TID) | ORAL | 0 refills | Status: DC | PRN

## 2021-04-11 MED ORDER — IBUPROFEN 400 MG PO TABS
600.0000 mg | ORAL_TABLET | Freq: Once | ORAL | Status: AC
  Administered 2021-04-11: 600 mg via ORAL
  Filled 2021-04-11: qty 1
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
## 2021-04-11 NOTE — ED Triage Notes (Signed)
Pt reports cough, generalized body aches, lack of appetite, and fever since Wednesday. Tmax 103.8. [facility] but reports could not wait one hour for the results so he came here.

## 2021-04-11 NOTE — Discharge Instructions (Addendum)
Return to ED with any new or worsening symptoms.  You have the flu this is a viral infection that will likely start to improve after 7-10 days, antibiotics are not helpful in treating viral infections. You may use Zofran as needed for nausea. Since your symptoms have been present for more than 2 days Tamiflu will give no additional benefit.  Please make sure you are drinking plenty of fluids. You can treat your symptoms supportively with tylenol 1000mg  and ibuprofen 600 mg every 6 hours for fevers and pains. For nasal congestion you can use Zyrtec and Flonase to help with nasal congestion. To treat cough you can use over the counter cough medications such as Mucinex DM or Robitussin and throat lozenges. If your symptoms are not improving please follow up with you Primary doctor.   If you develop persistent fevers, shortness of breath or difficulty breathing, chest pain, severe headache and neck pain, persistent nausea and vomiting or other new or concerning symptoms return to the Emergency department.

## 2021-10-08 ENCOUNTER — Emergency Department (HOSPITAL_COMMUNITY)
Admission: EM | Admit: 2021-10-08 | Discharge: 2021-10-08 | Disposition: A | Discharge status: Home / Self Care | Payer: Self-pay | Attending: Emergency Medicine | Admitting: Emergency Medicine

## 2021-10-08 DIAGNOSIS — Z5321 Procedure and treatment not carried out due to patient leaving prior to being seen by health care provider: Secondary | ICD-10-CM | POA: Insufficient documentation

## 2021-10-08 DIAGNOSIS — G43909 Migraine, unspecified, not intractable, without status migrainosus: Secondary | ICD-10-CM | POA: Insufficient documentation

## 2021-10-08 NOTE — ED Triage Notes (Signed)
Pt. Stated, [name] had a migraine for a month ?

## 2021-10-08 NOTE — ED Notes (Signed)
Pt name called for room with no answer. Pt moved to [facility] ?

## 2021-10-17 ENCOUNTER — Emergency Department (HOSPITAL_BASED_OUTPATIENT_CLINIC_OR_DEPARTMENT_OTHER)
Admission: EM | Admit: 2021-10-17 | Discharge: 2021-10-17 | Disposition: A | Discharge status: Home / Self Care | Payer: Self-pay | Attending: Emergency Medicine | Admitting: Emergency Medicine

## 2021-10-17 ENCOUNTER — Other Ambulatory Visit: Payer: Self-pay

## 2021-10-17 ENCOUNTER — Encounter (HOSPITAL_BASED_OUTPATIENT_CLINIC_OR_DEPARTMENT_OTHER): Payer: Self-pay

## 2021-10-17 DIAGNOSIS — D72829 Elevated white blood cell count, unspecified: Secondary | ICD-10-CM | POA: Insufficient documentation

## 2021-10-17 DIAGNOSIS — K529 Noninfective gastroenteritis and colitis, unspecified: Secondary | ICD-10-CM | POA: Insufficient documentation

## 2021-10-17 LAB — CBC WITH DIFFERENTIAL/PLATELET
Abs Immature Granulocytes: 0.07 10*3/uL (ref 0.00–0.07)
Basophils Absolute: 0.1 10*3/uL (ref 0.0–0.1)
Basophils Relative: 0 %
Eosinophils Absolute: 0.1 10*3/uL (ref 0.0–0.5)
Eosinophils Relative: 1 %
HCT: 40.6 % (ref 39.0–52.0)
Hemoglobin: 13.1 g/dL (ref 13.0–17.0)
Immature Granulocytes: 0 %
Lymphocytes Relative: 5 %
Lymphs Abs: 0.9 10*3/uL (ref 0.7–4.0)
MCH: 27.5 pg (ref 26.0–34.0)
MCHC: 32.3 g/dL (ref 30.0–36.0)
MCV: 85.3 fL (ref 80.0–100.0)
Monocytes Absolute: 0.9 10*3/uL (ref 0.1–1.0)
Monocytes Relative: 5 %
Neutro Abs: 15.1 10*3/uL — ABNORMAL HIGH (ref 1.7–7.7)
Neutrophils Relative %: 89 %
Platelets: 256 10*3/uL (ref 150–400)
RBC: 4.76 MIL/uL (ref 4.22–5.81)
RDW: 12.9 % (ref 11.5–15.5)
WBC: 17.1 10*3/uL — ABNORMAL HIGH (ref 4.0–10.5)
nRBC: 0 % (ref 0.0–0.2)

## 2021-10-17 LAB — COMPREHENSIVE METABOLIC PANEL
ALT: 20 U/L (ref 0–44)
AST: 33 U/L (ref 15–41)
Albumin: 4.6 g/dL (ref 3.5–5.0)
Alkaline Phosphatase: 48 U/L (ref 38–126)
Anion gap: 13 (ref 5–15)
BUN: 17 mg/dL (ref 6–20)
CO2: 22 mmol/L (ref 22–32)
Calcium: 9.7 mg/dL (ref 8.9–10.3)
Chloride: 106 mmol/L (ref 98–111)
Creatinine, Ser: 1.12 mg/dL (ref 0.61–1.24)
GFR, Estimated: >60 mL/min (ref >60)
Glucose, Bld: 107 mg/dL — ABNORMAL HIGH (ref 70–99)
Potassium: 3.9 mmol/L (ref 3.5–5.1)
Sodium: 141 mmol/L (ref 135–145)
Total Bilirubin: 0.8 mg/dL (ref 0.3–1.2)
Total Protein: 7.9 g/dL (ref 6.5–8.1)

## 2021-10-17 MED ORDER — KETOROLAC TROMETHAMINE 15 MG/ML IJ SOLN
15.0000 mg | Freq: Once | INTRAMUSCULAR | Status: AC
  Administered 2021-10-17: 15 mg via INTRAVENOUS
  Filled 2021-10-17: qty 1

## 2021-10-17 MED ORDER — ONDANSETRON HCL 4 MG/2ML IJ SOLN
4.0000 mg | Freq: Once | INTRAMUSCULAR | Status: AC
  Administered 2021-10-17: 4 mg via INTRAVENOUS
  Filled 2021-10-17: qty 2

## 2021-10-17 MED ORDER — HALOPERIDOL LACTATE 5 MG/ML IJ SOLN
5.0000 mg | Freq: Once | INTRAMUSCULAR | Status: AC
  Administered 2021-10-17: 5 mg via INTRAVENOUS
  Filled 2021-10-17: qty 1

## 2021-10-17 MED ORDER — DICYCLOMINE HCL 20 MG PO TABS: 20.0000 mg | ORAL_TABLET | Freq: Two times a day (BID) | ORAL | 0 refills | Status: AC

## 2021-10-17 MED ORDER — SODIUM CHLORIDE 0.9 % IV BOLUS
1000.0000 mL | Freq: Once | INTRAVENOUS | Status: AC
  Administered 2021-10-17: 1000 mL via INTRAVENOUS

## 2021-10-17 MED ORDER — ONDANSETRON 4 MG PO TBDP: 4.0000 mg | ORAL_TABLET | Freq: Three times a day (TID) | ORAL | 0 refills | Status: AC | PRN

## 2021-10-17 NOTE — Discharge Instructions (Addendum)
Your work-up today was reassuring.  After receiving medications in the emergency room, and fluids your pain improved.  Gastroenteritis can take a few days before starts to improve.  It peaks at about 72 hours.  As we discussed marijuana use can also lead to your symptoms.  I do recommend cutting down on the marijuana use.  For of any worsening symptoms please return to the emergency room.  I have attached follow-up information for [facility] for you to establish care with.

## 2021-10-17 NOTE — ED Triage Notes (Signed)
Pt comes via [facility] for n/v/d since 3am no fevers.

## 2021-10-17 NOTE — ED Notes (Signed)
Pt is aware urine is needed 
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
## 2021-10-17 NOTE — ED Notes (Signed)
Pt refused further and requested discharge papers. Pt stated he was ready to go and his ride is just about here.

## 2022-10-05 ENCOUNTER — Telehealth: Payer: Self-pay

## 2022-10-05 NOTE — Telephone Encounter (Signed)
Outbound call placed to schedule appointment to establish care. Person who answered will have patient call back.

## 2024-02-22 ENCOUNTER — Emergency Department (HOSPITAL_COMMUNITY)
Admission: EM | Admit: 2024-02-22 | Discharge: 2024-02-22 | Disposition: A | Discharge status: Home / Self Care | Payer: Self-pay | Attending: Emergency Medicine | Admitting: Emergency Medicine

## 2024-02-22 ENCOUNTER — Encounter (HOSPITAL_COMMUNITY): Payer: Self-pay | Admitting: Emergency Medicine

## 2024-02-22 DIAGNOSIS — K047 Periapical abscess without sinus: Secondary | ICD-10-CM | POA: Insufficient documentation

## 2024-02-22 DIAGNOSIS — K029 Dental caries, unspecified: Secondary | ICD-10-CM | POA: Insufficient documentation

## 2024-02-22 DIAGNOSIS — K0381 Cracked tooth: Secondary | ICD-10-CM | POA: Insufficient documentation

## 2024-02-22 MED ORDER — AMOXICILLIN-POT CLAVULANATE 875-125 MG PO TABS: 1.0000 | ORAL_TABLET | Freq: Two times a day (BID) | ORAL | 0 refills | Status: DC

## 2024-02-22 MED ORDER — OXYCODONE HCL 5 MG PO TABS: 5.0000 mg | ORAL_TABLET | ORAL | 0 refills | Status: DC | PRN

## 2024-02-22 NOTE — Discharge Instructions (Signed)
 You were seen for your dental pain in the emergency department.   At home, please take Tylenol  and ibuprofen  for your pain.  You may also take the oxycodone  we have prescribed you for any breakthrough pain that may have.  Do not take this before driving or operating heavy machinery.  Do not take this medication with alcohol.  Take the antibiotics (augmentin ) to treat any infection.  Follow-up with a dentist in 2-3 days regarding your visit.    Return immediately to the emergency department if you experience any of the following: Worsening pain, fever, difficulty swallowing, difficulty breathing, or any other concerning symptoms.    Thank you for visiting our Emergency Department. It was a pleasure taking care of you today.

## 2024-02-22 NOTE — ED Notes (Signed)
 Pt verbalized understanding of discharge instructions. Opportunity for questions provided.

## 2024-02-22 NOTE — ED Provider Notes (Signed)
 [place] EMERGENCY DEPARTMENT AT [facility] Provider Note   CSN: [id number] Arrival date & time: 02/22/24  1130     Patient presents with: Dental Pain   [name] is a 43 y.o. male.   43 year old male who presents emergency department with dental pain.  Reports that several weeks ago he was eating some steak and cracked multiple molars.  Says that several days ago started having significant pain especially in his right lower molar and has felt some swelling there.  Having pain with swallowing.  No difficulty breathing.  No fevers.       Prior to Admission medications   Medication Sig Start Date End Date Taking? Authorizing Provider  amoxicillin -clavulanate (AUGMENTIN ) 875-125 MG tablet Take 1 tablet by mouth every 12 (twelve) hours. 02/22/24  Yes Yolande Lamar BROCKS, MD  oxyCODONE  (ROXICODONE ) 5 MG immediate release tablet Take 1 tablet (5 mg total) by mouth every 4 (four) hours as needed for severe pain (pain score 7-10). 02/22/24  Yes Yolande Lamar BROCKS, MD  dicyclomine  (BENTYL ) 20 MG tablet Take 1 tablet (20 mg total) by mouth 2 (two) times daily. 10/17/21   Hildegard Loge, PA-C  ondansetron  (ZOFRAN -ODT) 4 MG disintegrating tablet Take 1 tablet (4 mg total) by mouth every 8 (eight) hours as needed for nausea or vomiting. 10/17/21   Hildegard, Amjad, PA-C  rizatriptan  (MAXALT ) 10 MG tablet Take 1 tablet (10 mg total) by mouth 3 (three) times daily as needed for migraine. 09/15/18   Jenel Carlin POUR, MD  topiramate  (TOPAMAX ) 25 MG tablet Take one tablet at night for one week, then take 2 tablets at night for one week, then take 3 tablets at night. 09/25/18   Jenel Carlin POUR, MD    Allergies: Vicodin [hydrocodone -acetaminophen ]    Review of Systems  Updated Vital Signs BP 113/86   Pulse 75   Temp 98 F (36.7 C)   Resp 16   SpO2 92%   Physical Exam HENT:     Mouth/Throat:      Comments: Uvula: without deviation or edema. Uvula midline. Soft palate: without  swelling Sublingual: normal appearance/no brawny edema or tongue elevation Teeth and gums: Multiple fractured teeth.  Tenderness to palpation with small area of induration around tooth #30 but no fluctuance.   Tonsils: no erythema or exudates  Pulmonary:     Breath sounds: No stridor.     (all labs ordered are listed, but only abnormal results are displayed) Labs Reviewed - No data to display  EKG: None  Radiology: No results found.   Procedures   Medications Ordered in the ED - No data to display                                  Medical Decision Making Risk Prescription drug management.   [name] is a 43 y.o. male with comorbidities that complicate the patient evaluation including poor dentition who presents to the emergency department with dental pain  Initial Ddx:  Dental carie, periapical abscess, deep space infection, Ludwig's angina  MDM:  Feel that patient likely has a dental carie that is causing their symptoms.  Does have an area of induration but no obvious fluctuance concerning for periapical abscess.  No signs of Ludwig's angina.  Given their exam feel that deep space infection is less likely so we will hold off on CT imaging will treat with antibiotics.  Plan:  Augmentin  Oxycodone   Dentistry follow-up (patient says he already has this scheduled)  This patient presents to the ED for concern of complaints listed in HPI, this involves an extensive number of treatment options, and is a complaint that carries with it a high risk of complications and morbidity. Disposition including potential need for admission considered.   Dispo: DC Home. Return precautions discussed including, but not limited to, those listed in the AVS. Allowed pt time to ask questions which were answered fully prior to dc.  Records reviewed Outpatient Clinic Notes I have reviewed the patients home medications and made adjustments as needed  Final diagnoses:  Infected dental caries     ED Discharge Orders          Ordered    amoxicillin -clavulanate (AUGMENTIN ) 875-125 MG tablet  Every 12 hours        02/22/24 1200    oxyCODONE  (ROXICODONE ) 5 MG immediate release tablet  Every 4 hours PRN        02/22/24 1200               Yolande Lamar BROCKS, MD 02/22/24 1702

## 2024-02-22 NOTE — ED Triage Notes (Signed)
 Patient arrives ambulatory by POV c/o left sided dental and jaw pain. Reports 3 chipped teeth and painful swallowing. Requesting antibiotics.

## 2024-05-21 ENCOUNTER — Other Ambulatory Visit: Payer: Self-pay

## 2024-05-21 ENCOUNTER — Encounter (HOSPITAL_COMMUNITY): Payer: Self-pay | Admitting: Emergency Medicine

## 2024-05-21 ENCOUNTER — Emergency Department (HOSPITAL_COMMUNITY)
Admission: EM | Admit: 2024-05-21 | Discharge: 2024-05-21 | Disposition: A | Discharge status: Home / Self Care | Payer: Self-pay | Attending: Emergency Medicine | Admitting: Emergency Medicine

## 2024-05-21 DIAGNOSIS — K029 Dental caries, unspecified: Secondary | ICD-10-CM | POA: Insufficient documentation

## 2024-05-21 MED ORDER — PENICILLIN V POTASSIUM 500 MG PO TABS: 500.0000 mg | ORAL_TABLET | Freq: Four times a day (QID) | ORAL | 0 refills | Status: AC

## 2024-05-21 MED ORDER — IBUPROFEN 600 MG PO TABS: 600.0000 mg | ORAL_TABLET | Freq: Four times a day (QID) | ORAL | 0 refills | Status: AC | PRN

## 2024-05-21 NOTE — ED Triage Notes (Addendum)
 Pt reports dental pain on right side, needs 5-6 teeth pulled. Seen previously for same, prescribed antibiotic, says it didn't work, but pain medication helped.

## 2024-05-21 NOTE — ED Provider Notes (Signed)
 " [place] EMERGENCY DEPARTMENT AT [facility] Provider Note   CSN: [id number] Arrival date & time: 05/21/24  1346     Patient presents with: Dental Pain   [name] is a 43 y.o. male.   The history is provided by the patient and medical records. No language interpreter was used.  Dental Pain    43 year old male presenting with complaints of dental pain.  Patient states he has had recurrent pain to multiple teeth ongoing for the past 6 months.  Increasing pain with chewing and with temperature changes.  He has been seen and evaluated for this in the past and was given antibiotic and pain medication with temporary relief but now the pain returns.  He does not have a dentist currently.  He has been taking over-the-counter medication including Tylenol  and ibuprofen  without relief.  Prior to Admission medications  Medication Sig Start Date End Date Taking? Authorizing Provider  amoxicillin -clavulanate (AUGMENTIN ) 875-125 MG tablet Take 1 tablet by mouth every 12 (twelve) hours. 02/22/24   Yolande Lamar BROCKS, MD  dicyclomine  (BENTYL ) 20 MG tablet Take 1 tablet (20 mg total) by mouth 2 (two) times daily. 10/17/21   Hildegard Loge, PA-C  ondansetron  (ZOFRAN -ODT) 4 MG disintegrating tablet Take 1 tablet (4 mg total) by mouth every 8 (eight) hours as needed for nausea or vomiting. 10/17/21   Hildegard Loge, PA-C  oxyCODONE  (ROXICODONE ) 5 MG immediate release tablet Take 1 tablet (5 mg total) by mouth every 4 (four) hours as needed for severe pain (pain score 7-10). 02/22/24   Yolande Lamar BROCKS, MD  rizatriptan  (MAXALT ) 10 MG tablet Take 1 tablet (10 mg total) by mouth 3 (three) times daily as needed for migraine. 09/15/18   Jenel Carlin POUR, MD  topiramate  (TOPAMAX ) 25 MG tablet Take one tablet at night for one week, then take 2 tablets at night for one week, then take 3 tablets at night. 09/25/18   Jenel Carlin POUR, MD    Allergies: Vicodin [hydrocodone -acetaminophen ]    Review of Systems   All other systems reviewed and are negative.   Updated Vital Signs BP 94/72 (BP Location: Left Arm)   Pulse 79   Temp 98.1 F (36.7 C)   Resp 16   Ht 5' 9 (1.753 m)   Wt 73.5 kg   SpO2 97%   BMI 23.92 kg/m   Physical Exam Constitutional:      General: He is not in acute distress.    Appearance: He is well-developed.  HENT:     Head: Atraumatic.     Mouth/Throat:     Comments: Widespread dental decay without any obvious abscess or facial involvement. Eyes:     Conjunctiva/sclera: Conjunctivae normal.  Musculoskeletal:     Cervical back: Normal range of motion and neck supple. No tenderness.  Lymphadenopathy:     Cervical: No cervical adenopathy.  Skin:    Findings: No rash.  Neurological:     Mental Status: He is alert.     (all labs ordered are listed, but only abnormal results are displayed) Labs Reviewed - No data to display  EKG: None  Radiology: No results found.   Procedures   Medications Ordered in the ED - No data to display                                  Medical Decision Making  BP 94/72 (BP Location: Left Arm)  Pulse 79   Temp 98.1 F (36.7 C)   Resp 16   Ht 5' 9 (1.753 m)   Wt 73.5 kg   SpO2 97%   BMI 23.92 kg/m   38:65 PM  43 year old male presenting with complaints of dental pain.  Patient states he has had recurrent pain to multiple teeth ongoing for the past 6 months.  Increasing pain with chewing and with temperature changes.  He has been seen and evaluated for this in the past and was given antibiotic and pain medication with temporary relief but now the pain returns.  He does not have a dentist currently.  He has been taking over-the-counter medication including Tylenol  and ibuprofen  without relief.  Exam notable for widespread dental decay without any abscess or facial involvement.  Normal phonation.  No evidence of deep tissue infection.  Advanced imaging not indicated at this time.  Will treat with antibiotic  anti-inflammatory occasion.  Dental referral given as needed.     Final diagnoses:  Dental caries    ED Discharge Orders          Ordered    ibuprofen  (ADVIL ) 600 MG tablet  Every 6 hours PRN        05/21/24 1409    penicillin  v potassium (VEETID) 500 MG tablet  4 times daily        05/21/24 1409               Nivia Colon, PA-C 05/21/24 1410  "

## 2024-05-21 NOTE — Discharge Instructions (Addendum)
 It is important for you to follow-up closely with a dentist for further management of your dental infection.
